# Patient Record
Sex: Female | Born: 1960
Health system: Southern US, Community
[De-identification: ages and names within clinical notes are randomized; demographics above are authoritative.]

## PROBLEM LIST (undated history)

## (undated) DIAGNOSIS — K219 Gastro-esophageal reflux disease without esophagitis: Secondary | ICD-10-CM

## (undated) DIAGNOSIS — R5383 Other fatigue: Secondary | ICD-10-CM

## (undated) DIAGNOSIS — I639 Cerebral infarction, unspecified: Secondary | ICD-10-CM

## (undated) DIAGNOSIS — T4145XA Adverse effect of unspecified anesthetic, initial encounter: Secondary | ICD-10-CM

## (undated) DIAGNOSIS — R112 Nausea with vomiting, unspecified: Secondary | ICD-10-CM

## (undated) DIAGNOSIS — T8859XA Other complications of anesthesia, initial encounter: Secondary | ICD-10-CM

## (undated) DIAGNOSIS — E785 Hyperlipidemia, unspecified: Secondary | ICD-10-CM

## (undated) DIAGNOSIS — Z9889 Other specified postprocedural states: Secondary | ICD-10-CM

## (undated) DIAGNOSIS — G459 Transient cerebral ischemic attack, unspecified: Secondary | ICD-10-CM

## (undated) HISTORY — PX: BREAST EXCISIONAL BIOPSY: SUR124

## (undated) HISTORY — PX: COLONOSCOPY: SHX174

## (undated) HISTORY — DX: Transient cerebral ischemic attack, unspecified: G45.9

## (undated) HISTORY — DX: Cerebral infarction, unspecified: I63.9

## (undated) HISTORY — DX: Other fatigue: R53.83

---

## 1997-08-15 ENCOUNTER — Other Ambulatory Visit: Admission: RE | Admit: 1997-08-15 | Discharge: 1997-08-15 | Payer: Self-pay | Admitting: Internal Medicine

## 1999-10-05 ENCOUNTER — Other Ambulatory Visit: Admission: RE | Admit: 1999-10-05 | Discharge: 1999-10-05 | Payer: Self-pay | Admitting: Internal Medicine

## 1999-12-15 ENCOUNTER — Ambulatory Visit (HOSPITAL_COMMUNITY): Admission: RE | Admit: 1999-12-15 | Discharge: 1999-12-15 | Payer: Self-pay | Admitting: Internal Medicine

## 1999-12-15 ENCOUNTER — Encounter: Payer: Self-pay | Admitting: Internal Medicine

## 2000-01-19 ENCOUNTER — Other Ambulatory Visit: Admission: RE | Admit: 2000-01-19 | Discharge: 2000-01-19 | Payer: Self-pay | Admitting: Internal Medicine

## 2001-03-06 ENCOUNTER — Ambulatory Visit (HOSPITAL_COMMUNITY): Admission: RE | Admit: 2001-03-06 | Discharge: 2001-03-06 | Payer: Self-pay | Admitting: Internal Medicine

## 2001-03-06 ENCOUNTER — Encounter: Payer: Self-pay | Admitting: Internal Medicine

## 2003-02-06 ENCOUNTER — Other Ambulatory Visit: Admission: RE | Admit: 2003-02-06 | Discharge: 2003-02-06 | Payer: Self-pay | Admitting: Internal Medicine

## 2004-02-09 ENCOUNTER — Other Ambulatory Visit: Admission: RE | Admit: 2004-02-09 | Discharge: 2004-02-09 | Payer: Self-pay | Admitting: Internal Medicine

## 2004-03-12 ENCOUNTER — Ambulatory Visit (HOSPITAL_COMMUNITY): Admission: RE | Admit: 2004-03-12 | Discharge: 2004-03-12 | Payer: Self-pay | Admitting: Internal Medicine

## 2005-02-17 ENCOUNTER — Other Ambulatory Visit: Admission: RE | Admit: 2005-02-17 | Discharge: 2005-02-17 | Payer: Self-pay | Admitting: Family

## 2005-04-26 ENCOUNTER — Ambulatory Visit (HOSPITAL_COMMUNITY): Admission: RE | Admit: 2005-04-26 | Discharge: 2005-04-26 | Payer: Self-pay | Admitting: Internal Medicine

## 2006-06-05 ENCOUNTER — Ambulatory Visit (HOSPITAL_COMMUNITY): Admission: RE | Admit: 2006-06-05 | Discharge: 2006-06-05 | Payer: Self-pay | Admitting: Internal Medicine

## 2006-08-21 ENCOUNTER — Other Ambulatory Visit: Admission: RE | Admit: 2006-08-21 | Discharge: 2006-08-21 | Payer: Self-pay | Admitting: Geriatric Medicine

## 2007-06-25 ENCOUNTER — Ambulatory Visit (HOSPITAL_COMMUNITY): Admission: RE | Admit: 2007-06-25 | Discharge: 2007-06-25 | Payer: Self-pay | Admitting: Family Medicine

## 2007-07-03 ENCOUNTER — Encounter: Admission: RE | Admit: 2007-07-03 | Discharge: 2007-07-03 | Payer: Self-pay | Admitting: Internal Medicine

## 2007-08-04 ENCOUNTER — Emergency Department (HOSPITAL_COMMUNITY): Admission: EM | Admit: 2007-08-04 | Discharge: 2007-08-04 | Payer: Self-pay | Admitting: Emergency Medicine

## 2007-08-10 ENCOUNTER — Emergency Department (HOSPITAL_COMMUNITY): Admission: EM | Admit: 2007-08-10 | Discharge: 2007-08-10 | Payer: Self-pay | Admitting: Emergency Medicine

## 2008-02-12 ENCOUNTER — Encounter: Admission: RE | Admit: 2008-02-12 | Discharge: 2008-02-12 | Payer: Self-pay | Admitting: Internal Medicine

## 2008-11-13 ENCOUNTER — Encounter: Admission: RE | Admit: 2008-11-13 | Discharge: 2008-11-13 | Payer: Self-pay | Admitting: Obstetrics and Gynecology

## 2011-02-09 ENCOUNTER — Ambulatory Visit (INDEPENDENT_AMBULATORY_CARE_PROVIDER_SITE_OTHER): Payer: 59

## 2011-02-09 ENCOUNTER — Inpatient Hospital Stay (INDEPENDENT_AMBULATORY_CARE_PROVIDER_SITE_OTHER)
Admission: RE | Admit: 2011-02-09 | Discharge: 2011-02-09 | Disposition: A | Discharge status: Home / Self Care | Payer: 59 | Source: Ambulatory Visit | Attending: Family Medicine | Admitting: Family Medicine

## 2011-02-09 DIAGNOSIS — S92919A Unspecified fracture of unspecified toe(s), initial encounter for closed fracture: Secondary | ICD-10-CM

## 2011-10-31 ENCOUNTER — Emergency Department (HOSPITAL_COMMUNITY)
Admission: EM | Admit: 2011-10-31 | Discharge: 2011-10-31 | Disposition: A | Discharge status: Home / Self Care | Payer: 59 | Source: Home / Self Care

## 2011-10-31 ENCOUNTER — Encounter (HOSPITAL_COMMUNITY): Payer: Self-pay | Admitting: Emergency Medicine

## 2011-10-31 DIAGNOSIS — T63391A Toxic effect of venom of other spider, accidental (unintentional), initial encounter: Secondary | ICD-10-CM

## 2011-10-31 DIAGNOSIS — T6391XA Toxic effect of contact with unspecified venomous animal, accidental (unintentional), initial encounter: Secondary | ICD-10-CM

## 2011-10-31 DIAGNOSIS — T63301A Toxic effect of unspecified spider venom, accidental (unintentional), initial encounter: Secondary | ICD-10-CM

## 2011-10-31 MED ORDER — TRAMADOL HCL 50 MG PO TABS: 50.0000 mg | ORAL_TABLET | Freq: Four times a day (QID) | ORAL | Status: AC | PRN

## 2011-10-31 MED ORDER — DOXYCYCLINE HYCLATE 100 MG PO TABS: 100.0000 mg | ORAL_TABLET | Freq: Two times a day (BID) | ORAL | Status: DC

## 2011-10-31 MED ORDER — DOXYCYCLINE HYCLATE 100 MG PO TABS: 100.0000 mg | ORAL_TABLET | Freq: Two times a day (BID) | ORAL | Status: AC

## 2011-10-31 MED ORDER — TRAMADOL HCL 50 MG PO TABS: 50.0000 mg | ORAL_TABLET | Freq: Four times a day (QID) | ORAL | Status: DC | PRN

## 2011-10-31 NOTE — ED Notes (Signed)
PT HERE WITH C/O POSS SPIDER BITE TO RIGHT THIGH THAT STARTED Saturday.STAES SHE WAS ABLE TO POP IT WITH MIN DRAINAGE.DENIES FEVER,CHILLS

## 2011-10-31 NOTE — Discharge Instructions (Signed)
Spider Bite  Spider bites are not common. Most spider bites do not cause serious problems. The elderly, very young children, and people with certain existing medical conditions are more likely to experience significant symptoms.  SYMPTOMS   Spider bites may not cause any pain at first. Within 1 or 2 days of the bite, there may be swelling, redness, and pain in the bite area. However, some spider bites can cause pain within the first hour.  TREATMENT   Your caregiver may prescribe antibiotic medicine if a bacterial infection develops in the bite. However, not all spider bites require antibiotics or prescription medicines.   HOME CARE INSTRUCTIONS   Do not scratch the bite area.   Keep the bite area clean and dry. Wash the area with soap and water as directed.   Put ice or cool compresses on the bite area.   Put ice in a plastic bag.   Place a towel between your skin and the bag.   Leave the ice on for 20 minutes, 4 times a day for the first 2 to 3 days, or as directed.   Keep the bite area elevated above the level of your heart. This helps reduce redness and swelling.   Only take over-the-counter or prescription medicines as directed by your caregiver.   If you are given antibiotics, take them as directed. Finish them even if you start to feel better.  You may need a tetanus shot if:   You cannot remember when you had your last tetanus shot.   You have never had a tetanus shot.   The injury broke your skin.  If you get a tetanus shot, your arm may swell, get red, and feel warm to the touch. This is common and not a problem. If you need a tetanus shot and you choose not to have one, there is a rare chance of getting tetanus. Sickness from tetanus can be serious.  SEEK MEDICAL CARE IF:  Your bite is not better after 3 days of treatment.  SEEK IMMEDIATE MEDICAL CARE IF:   Your bite turns purple or develops increased swelling, pain, or redness.   You develop shortness of breath or chest pain.   You have  muscle cramps or painful muscle spasms.   You develop abdominal pain, nausea, or vomiting.   You feel unusually tired or sleepy.  MAKE SURE YOU:   Understand these instructions.   Will watch your condition.   Will get help right away if you are not doing well or get worse.  Document Released: 06/16/2004 Document Revised: 04/28/2011 Document Reviewed: 12/08/2010  ExitCare Patient Information 2012 [employer].

## 2011-10-31 NOTE — ED Provider Notes (Signed)
History     CSN: [id number]  Arrival date & time 10/31/11  1233   First MD Initiated Contact with Patient 10/31/11 1328      No chief complaint on file.   (Consider location/radiation/quality/duration/timing/severity/associated sxs/prior treatment) HPI -year-old female who presents to urgent care with main concern of swelling on the medial aspect of the left thigh. She reports this initially started approximately 3-4 days ago and she thinks it was a spider bite. This was getting progressively worse and this morning she noted a big lump on the medial aspect of the thigh 1 inch above the knee area and she squeezed it and a lot of pus came out. She denies noticing any blood from the area. Patient reports this is getting better now, denies fevers and chills, other systemic symptoms. She denies similar episodes in the past. She reports mild pain in that area, intermittent, 5/10 in severity when present, no specific alleviating or aggravating factors, pain is mostly sharp, nonradiating.  No past medical history on file.  No past surgical history on file.  No family history on file.  History  Substance Use Topics  . Smoking status: Not on file  . Smokeless tobacco: Not on file  . Alcohol Use: Not on file    OB History    No data available      Review of Systems  Constitutional: Denies fever, chills, diaphoresis, appetite change and fatigue.  HEENT: Denies photophobia, eye pain, redness, hearing loss, ear pain, congestion, sore throat, rhinorrhea, sneezing, mouth sores, trouble swallowing, neck pain, neck stiffness and tinnitus.   Respiratory: Denies SOB, DOE, cough, chest tightness,  and wheezing.   Cardiovascular: Denies chest pain, palpitations and leg swelling.  Gastrointestinal: Denies nausea, vomiting, abdominal pain, diarrhea, constipation, blood in stool and abdominal distention.  Genitourinary: Denies dysuria, urgency, frequency, hematuria, flank pain and difficulty  urinating.  Musculoskeletal: Denies myalgias, back pain, joint swelling, arthralgias and gait problem.  Skin: per HPI Neurological: Denies dizziness, seizures, syncope, weakness, light-headedness, numbness and headaches.  Hematological: Denies adenopathy. Easy bruising, personal or family bleeding history  Psychiatric/Behavioral: Denies suicidal ideation, mood changes, confusion, nervousness, sleep disturbance and agitation  Allergies  Review of patient's allergies indicates not on file.  Home Medications  No current outpatient prescriptions on file.  There were no vitals taken for this visit.  Physical Exam  Constitutional: Vital signs reviewed.  Patient is a well-developed and well-nourished in no acute distress and cooperative with exam. Alert and oriented x3.  Cardiovascular: RRR, S1 normal, S2 normal, no MRG, pulses symmetric and intact bilaterally Pulmonary/Chest: CTAB, no wheezes, rales, or rhonchi Abdominal: Soft. Non-tender, non-distended, bowel sounds are normal, no masses, organomegaly, or guarding present.  Skin: Warm, dry and intact. No rash, cyanosis, or clubbing.  small area of erythema noted on the medial aspect of the left thigh approximately 1 inch above the knee, erythema is approximately 2 inches in diameter, slightly tender to palpation, area is also warm to touch   ED Course  Procedures (including critical care time)   Spider bite with now infected area - I have recommended course of antibiotic along with applying warm compresses to the area - will provide prescription for analgesia for adequate pain control - Patient was advised to see primary care physician if symptoms get worse or do not improve over the next 24-48 hours  MDM  Infected area secondary to spider bite Requires course of antibiotic        Dorothea Ogle, MD  10/31/11 1411 

## 2011-11-01 ENCOUNTER — Telehealth (HOSPITAL_COMMUNITY): Payer: Self-pay | Admitting: *Deleted

## 2011-11-01 MED ORDER — DOXYCYCLINE HYCLATE 100 MG PO TABS: 100.0000 mg | ORAL_TABLET | Freq: Two times a day (BID) | ORAL | Status: AC

## 2011-11-01 MED ORDER — TRAMADOL HCL 50 MG PO TABS: 100.0000 mg | ORAL_TABLET | Freq: Three times a day (TID) | ORAL | Status: AC | PRN

## 2011-11-01 NOTE — ED Notes (Signed)
1150  Pt. called with a question about her Rx.'s. Pt. in a meeting here at the hospital. I left a message on her cell phone to call.  I called the [facility] pharmacy and they had not received the Rx.'s yet.   Hannah Brady 11/01/2011

## 2011-11-01 NOTE — ED Notes (Signed)
Pt. called back and and said she initially got the hard copy of the Rx.'s but asked about faxing them to the pharmacy.  She said the nurse took the Rx.'s back and told her the doctor would e-prescribe them. She said it was not at the pharmacy.  I told her I would talk to the doctor here now and have them e-prescribed now.  I asked Dr. Lorenz Coaster to e-prescribe them to the [facility] pharmacy. I called the pharmacy and told them they were being sent now and pt. would be in at 1330 to pick them up.

## 2012-03-29 ENCOUNTER — Other Ambulatory Visit: Payer: Self-pay | Admitting: Internal Medicine

## 2012-03-29 DIAGNOSIS — R921 Mammographic calcification found on diagnostic imaging of breast: Secondary | ICD-10-CM

## 2012-03-30 ENCOUNTER — Other Ambulatory Visit (HOSPITAL_COMMUNITY)
Admission: RE | Admit: 2012-03-30 | Discharge: 2012-03-30 | Disposition: A | Discharge status: Home / Self Care | Payer: 59 | Source: Ambulatory Visit | Attending: Internal Medicine | Admitting: Internal Medicine

## 2012-03-30 ENCOUNTER — Other Ambulatory Visit: Payer: Self-pay | Admitting: Internal Medicine

## 2012-03-30 DIAGNOSIS — Z1151 Encounter for screening for human papillomavirus (HPV): Secondary | ICD-10-CM | POA: Insufficient documentation

## 2012-03-30 DIAGNOSIS — Z01419 Encounter for gynecological examination (general) (routine) without abnormal findings: Secondary | ICD-10-CM | POA: Insufficient documentation

## 2012-03-30 DIAGNOSIS — R87810 Cervical high risk human papillomavirus (HPV) DNA test positive: Secondary | ICD-10-CM | POA: Insufficient documentation

## 2012-04-09 ENCOUNTER — Ambulatory Visit
Admission: RE | Admit: 2012-04-09 | Discharge: 2012-04-09 | Disposition: A | Discharge status: Home / Self Care | Payer: 59 | Source: Ambulatory Visit | Attending: Internal Medicine | Admitting: Internal Medicine

## 2012-04-09 DIAGNOSIS — R921 Mammographic calcification found on diagnostic imaging of breast: Secondary | ICD-10-CM

## 2012-12-07 ENCOUNTER — Encounter: Payer: Self-pay | Admitting: Internal Medicine

## 2013-07-01 ENCOUNTER — Other Ambulatory Visit: Payer: Self-pay

## 2013-07-01 DIAGNOSIS — Z1231 Encounter for screening mammogram for malignant neoplasm of breast: Secondary | ICD-10-CM

## 2013-07-18 ENCOUNTER — Ambulatory Visit: Payer: 59

## 2013-08-23 ENCOUNTER — Ambulatory Visit: Payer: 59

## 2013-10-23 ENCOUNTER — Ambulatory Visit: Payer: 59

## 2013-10-30 ENCOUNTER — Encounter (INDEPENDENT_AMBULATORY_CARE_PROVIDER_SITE_OTHER): Payer: Self-pay

## 2013-10-30 ENCOUNTER — Ambulatory Visit: Payer: 59

## 2013-10-30 ENCOUNTER — Ambulatory Visit
Admission: RE | Admit: 2013-10-30 | Discharge: 2013-10-30 | Disposition: A | Discharge status: Home / Self Care | Payer: 59 | Source: Ambulatory Visit

## 2013-10-30 DIAGNOSIS — Z1231 Encounter for screening mammogram for malignant neoplasm of breast: Secondary | ICD-10-CM

## 2013-12-27 ENCOUNTER — Other Ambulatory Visit: Payer: Self-pay | Admitting: Obstetrics and Gynecology

## 2013-12-27 ENCOUNTER — Other Ambulatory Visit (HOSPITAL_COMMUNITY)
Admission: RE | Admit: 2013-12-27 | Discharge: 2013-12-27 | Disposition: A | Discharge status: Home / Self Care | Payer: 59 | Source: Ambulatory Visit | Attending: Obstetrics and Gynecology | Admitting: Obstetrics and Gynecology

## 2013-12-27 DIAGNOSIS — Z01419 Encounter for gynecological examination (general) (routine) without abnormal findings: Secondary | ICD-10-CM | POA: Diagnosis present

## 2013-12-31 LAB — CYTOLOGY - PAP

## 2015-03-19 ENCOUNTER — Other Ambulatory Visit: Payer: Self-pay | Admitting: Obstetrics and Gynecology

## 2015-03-19 ENCOUNTER — Other Ambulatory Visit (HOSPITAL_COMMUNITY)
Admission: RE | Admit: 2015-03-19 | Discharge: 2015-03-19 | Disposition: A | Discharge status: Home / Self Care | Payer: 59 | Source: Ambulatory Visit | Attending: Obstetrics and Gynecology | Admitting: Obstetrics and Gynecology

## 2015-03-19 DIAGNOSIS — Z1151 Encounter for screening for human papillomavirus (HPV): Secondary | ICD-10-CM | POA: Diagnosis not present

## 2015-03-19 DIAGNOSIS — Z01419 Encounter for gynecological examination (general) (routine) without abnormal findings: Secondary | ICD-10-CM | POA: Diagnosis present

## 2015-03-23 LAB — CYTOLOGY - PAP

## 2015-03-25 ENCOUNTER — Other Ambulatory Visit: Payer: Self-pay

## 2015-03-25 DIAGNOSIS — Z1231 Encounter for screening mammogram for malignant neoplasm of breast: Secondary | ICD-10-CM

## 2015-03-26 ENCOUNTER — Ambulatory Visit
Admission: RE | Admit: 2015-03-26 | Discharge: 2015-03-26 | Disposition: A | Discharge status: Home / Self Care | Payer: 59 | Source: Ambulatory Visit

## 2015-03-26 DIAGNOSIS — Z1231 Encounter for screening mammogram for malignant neoplasm of breast: Secondary | ICD-10-CM

## 2015-06-03 MED FILL — ESTRADIOL 0.075 MG PATCH: 0.075 | 28 days supply | Qty: 8 | Fill #1

## 2015-06-15 ENCOUNTER — Observation Stay (HOSPITAL_COMMUNITY): Payer: 59

## 2015-06-15 ENCOUNTER — Emergency Department (HOSPITAL_COMMUNITY): Payer: 59

## 2015-06-15 ENCOUNTER — Encounter (HOSPITAL_COMMUNITY): Payer: Self-pay | Admitting: Emergency Medicine

## 2015-06-15 ENCOUNTER — Inpatient Hospital Stay (HOSPITAL_COMMUNITY)
Admission: EM | Admit: 2015-06-15 | Discharge: 2015-06-17 | DRG: 041 | Disposition: A | Discharge status: Home / Self Care | Payer: 59 | Attending: Internal Medicine | Admitting: Internal Medicine

## 2015-06-15 ENCOUNTER — Ambulatory Visit (HOSPITAL_BASED_OUTPATIENT_CLINIC_OR_DEPARTMENT_OTHER): Payer: 59

## 2015-06-15 DIAGNOSIS — R531 Weakness: Secondary | ICD-10-CM | POA: Insufficient documentation

## 2015-06-15 DIAGNOSIS — G8194 Hemiplegia, unspecified affecting left nondominant side: Secondary | ICD-10-CM | POA: Diagnosis present

## 2015-06-15 DIAGNOSIS — R4781 Slurred speech: Secondary | ICD-10-CM | POA: Diagnosis present

## 2015-06-15 DIAGNOSIS — R030 Elevated blood-pressure reading, without diagnosis of hypertension: Secondary | ICD-10-CM | POA: Diagnosis not present

## 2015-06-15 DIAGNOSIS — I63411 Cerebral infarction due to embolism of right middle cerebral artery: Secondary | ICD-10-CM | POA: Diagnosis not present

## 2015-06-15 DIAGNOSIS — I639 Cerebral infarction, unspecified: Secondary | ICD-10-CM | POA: Diagnosis not present

## 2015-06-15 DIAGNOSIS — R29700 NIHSS score 0: Secondary | ICD-10-CM | POA: Diagnosis present

## 2015-06-15 DIAGNOSIS — G459 Transient cerebral ischemic attack, unspecified: Secondary | ICD-10-CM

## 2015-06-15 DIAGNOSIS — R739 Hyperglycemia, unspecified: Secondary | ICD-10-CM | POA: Diagnosis present

## 2015-06-15 DIAGNOSIS — I10 Essential (primary) hypertension: Secondary | ICD-10-CM | POA: Diagnosis present

## 2015-06-15 DIAGNOSIS — E785 Hyperlipidemia, unspecified: Secondary | ICD-10-CM | POA: Diagnosis present

## 2015-06-15 DIAGNOSIS — IMO0001 Reserved for inherently not codable concepts without codable children: Secondary | ICD-10-CM | POA: Diagnosis present

## 2015-06-15 DIAGNOSIS — G43009 Migraine without aura, not intractable, without status migrainosus: Secondary | ICD-10-CM | POA: Diagnosis not present

## 2015-06-15 DIAGNOSIS — I63231 Cerebral infarction due to unspecified occlusion or stenosis of right carotid arteries: Secondary | ICD-10-CM | POA: Diagnosis not present

## 2015-06-15 DIAGNOSIS — M6289 Other specified disorders of muscle: Secondary | ICD-10-CM | POA: Diagnosis not present

## 2015-06-15 DIAGNOSIS — R29810 Facial weakness: Secondary | ICD-10-CM | POA: Diagnosis not present

## 2015-06-15 DIAGNOSIS — I63232 Cerebral infarction due to unspecified occlusion or stenosis of left carotid arteries: Secondary | ICD-10-CM | POA: Diagnosis not present

## 2015-06-15 DIAGNOSIS — I6789 Other cerebrovascular disease: Secondary | ICD-10-CM | POA: Diagnosis not present

## 2015-06-15 HISTORY — DX: Nausea with vomiting, unspecified: Z98.890

## 2015-06-15 HISTORY — DX: Nausea with vomiting, unspecified: R11.2

## 2015-06-15 HISTORY — DX: Other complications of anesthesia, initial encounter: T88.59XA

## 2015-06-15 HISTORY — DX: Hyperlipidemia, unspecified: E78.5

## 2015-06-15 HISTORY — DX: Adverse effect of unspecified anesthetic, initial encounter: T41.45XA

## 2015-06-15 HISTORY — DX: Cerebral infarction, unspecified: I63.9

## 2015-06-15 LAB — CBC WITH DIFFERENTIAL/PLATELET
BASOS ABS: 0 10*3/uL (ref 0.0–0.1)
Basophils Relative: 1 %
EOS PCT: 2 %
Eosinophils Absolute: 0.1 10*3/uL (ref 0.0–0.7)
HEMATOCRIT: 42.4 % (ref 36.0–46.0)
Hemoglobin: 14.8 g/dL (ref 12.0–15.0)
LYMPHS PCT: 36 %
Lymphs Abs: 2.7 10*3/uL (ref 0.7–4.0)
MCH: 30.3 pg (ref 26.0–34.0)
MCHC: 34.9 g/dL (ref 30.0–36.0)
MCV: 86.9 fL (ref 78.0–100.0)
MONO ABS: 0.6 10*3/uL (ref 0.1–1.0)
MONOS PCT: 8 %
NEUTROS ABS: 4.1 10*3/uL (ref 1.7–7.7)
Neutrophils Relative %: 53 %
PLATELETS: 194 10*3/uL (ref 150–400)
RBC: 4.88 MIL/uL (ref 3.87–5.11)
RDW: 13.7 % (ref 11.5–15.5)
WBC: 7.5 10*3/uL (ref 4.0–10.5)

## 2015-06-15 LAB — COMPREHENSIVE METABOLIC PANEL
ALBUMIN: 3.8 g/dL (ref 3.5–5.0)
ALT: 23 U/L (ref 14–54)
AST: 19 U/L (ref 15–41)
Alkaline Phosphatase: 43 U/L (ref 38–126)
Anion gap: 12 (ref 5–15)
BILIRUBIN TOTAL: 0.7 mg/dL (ref 0.3–1.2)
BUN: 13 mg/dL (ref 6–20)
CO2: 23 mmol/L (ref 22–32)
CREATININE: 0.83 mg/dL (ref 0.44–1.00)
Calcium: 9.3 mg/dL (ref 8.9–10.3)
Chloride: 106 mmol/L (ref 101–111)
GFR calc Af Amer: >60 mL/min (ref >60)
GLUCOSE: 123 mg/dL — AB (ref 65–99)
Potassium: 3.7 mmol/L (ref 3.5–5.1)
Sodium: 141 mmol/L (ref 135–145)
TOTAL PROTEIN: 6.2 g/dL — AB (ref 6.5–8.1)

## 2015-06-15 LAB — URINALYSIS, ROUTINE W REFLEX MICROSCOPIC
Bilirubin Urine: NEGATIVE
Glucose, UA: NEGATIVE mg/dL
Hgb urine dipstick: NEGATIVE
KETONES UR: NEGATIVE mg/dL
LEUKOCYTES UA: NEGATIVE
NITRITE: NEGATIVE
PROTEIN: NEGATIVE mg/dL
SPECIFIC GRAVITY, URINE: 1.018 (ref 1.005–1.030)
pH: 6 (ref 5.0–8.0)

## 2015-06-15 LAB — RAPID URINE DRUG SCREEN, HOSP PERFORMED
Amphetamines: NOT DETECTED
BARBITURATES: NOT DETECTED
BENZODIAZEPINES: NOT DETECTED
Cocaine: NOT DETECTED
Opiates: NOT DETECTED
Tetrahydrocannabinol: NOT DETECTED

## 2015-06-15 LAB — TROPONIN I

## 2015-06-15 LAB — ETHANOL

## 2015-06-15 LAB — CBG MONITORING, ED: Glucose-Capillary: 114 mg/dL — ABNORMAL HIGH (ref 65–99)

## 2015-06-15 MED ORDER — ASPIRIN 325 MG PO TABS
325.0000 mg | ORAL_TABLET | Freq: Every day | ORAL | Status: DC
  Administered 2015-06-16 – 2015-06-17 (×2): 325 mg via ORAL
  Filled 2015-06-15 (×3): qty 1

## 2015-06-15 MED ORDER — IOHEXOL 350 MG/ML SOLN
50.0000 mL | Freq: Once | INTRAVENOUS | Status: AC | PRN
  Administered 2015-06-15: 50 mL via INTRAVENOUS

## 2015-06-15 MED ORDER — STROKE: EARLY STAGES OF RECOVERY BOOK
Freq: Once | Status: DC
  Filled 2015-06-15: qty 1

## 2015-06-15 MED ORDER — ONDANSETRON HCL 4 MG/2ML IJ SOLN: 4.0000 mg | Freq: Four times a day (QID) | INTRAMUSCULAR | Status: DC | PRN

## 2015-06-15 MED ORDER — ASPIRIN 300 MG RE SUPP: 300.0000 mg | Freq: Every day | RECTAL | Status: DC

## 2015-06-15 MED ORDER — ACETAMINOPHEN 650 MG RE SUPP: 650.0000 mg | Freq: Four times a day (QID) | RECTAL | Status: DC | PRN

## 2015-06-15 MED ORDER — SODIUM CHLORIDE 0.9 % IJ SOLN: 3.0000 mL | INTRAMUSCULAR | Status: DC | PRN

## 2015-06-15 MED ORDER — HYDRALAZINE HCL 20 MG/ML IJ SOLN: 5.0000 mg | INTRAMUSCULAR | Status: DC | PRN

## 2015-06-15 MED ORDER — ONDANSETRON HCL 4 MG PO TABS: 4.0000 mg | ORAL_TABLET | Freq: Four times a day (QID) | ORAL | Status: DC | PRN

## 2015-06-15 MED ORDER — ENOXAPARIN SODIUM 40 MG/0.4ML ~~LOC~~ SOLN
40.0000 mg | SUBCUTANEOUS | Status: DC
  Filled 2015-06-15 (×2): qty 0.4

## 2015-06-15 MED ORDER — ASPIRIN 325 MG PO TABS: 325.0000 mg | ORAL_TABLET | Freq: Every day | ORAL | Status: DC

## 2015-06-15 MED ORDER — ACETAMINOPHEN 325 MG PO TABS
650.0000 mg | ORAL_TABLET | Freq: Four times a day (QID) | ORAL | Status: DC | PRN
  Administered 2015-06-15 – 2015-06-16 (×3): 650 mg via ORAL
  Filled 2015-06-15 (×3): qty 2

## 2015-06-15 MED ORDER — SODIUM CHLORIDE 0.9 % IV SOLN: 250.0000 mL | INTRAVENOUS | Status: DC | PRN

## 2015-06-15 MED ORDER — SODIUM CHLORIDE 0.9 % IJ SOLN
3.0000 mL | Freq: Two times a day (BID) | INTRAMUSCULAR | Status: DC
  Administered 2015-06-15 – 2015-06-16 (×3): 3 mL via INTRAVENOUS

## 2015-06-15 MED ORDER — STROKE: EARLY STAGES OF RECOVERY BOOK
Freq: Once | Status: AC
  Administered 2015-06-15: 14:00:00

## 2015-06-15 MED ORDER — SODIUM CHLORIDE 0.9 % IJ SOLN: 3.0000 mL | Freq: Two times a day (BID) | INTRAMUSCULAR | Status: DC

## 2015-06-15 MED ORDER — ASPIRIN 81 MG PO CHEW
324.0000 mg | CHEWABLE_TABLET | Freq: Once | ORAL | Status: AC
  Administered 2015-06-15: 324 mg via ORAL
  Filled 2015-06-15: qty 4

## 2015-06-15 NOTE — ED Notes (Signed)
MD Nandigam at the bedside

## 2015-06-15 NOTE — ED Notes (Signed)
Pt ambulatory w/ steady gait to restroom. 

## 2015-06-15 NOTE — Care Management Note (Signed)
Case Management Note  Patient Details  Name: Hannah Brady MRN: [id number] Date of Birth: 12-27-1960  Subjective/Objective:                    Action/Plan: Patient was admitted with CVA. Lives at home with spouse. Will follow for discharge needs pending PT/OT evals and physician orders.  Expected Discharge Date:                  Expected Discharge Plan:     In-House Referral:     Discharge planning Services     Post Acute Care Choice:    Choice offered to:     DME Arranged:    DME Agency:     HH Arranged:    HH Agency:     Status of Service:  In process, will continue to follow  Medicare Important Message Given:    Date Medicare IM Given:    Medicare IM give by:    Date Additional Medicare IM Given:    Additional Medicare Important Message give by:     If discussed at [place] of Stay Meetings, dates discussed:    Additional CommentsRolm Baptise, RN 06/15/2015, 2:04 PM [phone]

## 2015-06-15 NOTE — Progress Notes (Signed)
  Echocardiogram 2D Echocardiogram has been performed.  Tresa Res 06/15/2015, 5:48 PM

## 2015-06-15 NOTE — Progress Notes (Signed)
Patient arrived from the ED accompanied by family. Safety precautions and orders reviewed with patient. TELE applied and confirmed. MD paged r/t pt arrival. No other distress noted. Will continue to monitor.   Ave Filter, RN

## 2015-06-15 NOTE — ED Notes (Signed)
Pt returned from CT °

## 2015-06-15 NOTE — ED Notes (Signed)
Attempted Report x1.   

## 2015-06-15 NOTE — ED Notes (Signed)
Pt being transported upstairs by [employer]

## 2015-06-15 NOTE — H&P (Signed)
[facility] History and Physical  Hannah Brady [id number] DOB: 02/25/1961 DOA: 06/15/2015  Referring physician: Dr Roxanne Mins - [facility] PCP: Irven Shelling, MD   Chief Complaint: L sided weakness  HPI: Hannah Brady is a 55 y.o. female  Pt woke from sleep @3 :30 on day of presentation unable to get out of bed due to L sided paralysis, including LE,UE, and face. Slurred speech per husband. Last nml 18:30 on 06/14/15. By the time pt presented to ED sx had completely resolved. Only current complaint is a mild R sided HA which started during EMS transport to ED. No previous attacks. No alleviating or aggravating factors.    Review of Systems:  Constitutional:  No weight loss, night sweats, Fevers, chills, fatigue.  HEENT:  No  Difficulty swallowing,Tooth/dental problems,Sore throat, Cardio-vascular:  No chest pain, Orthopnea, PND, swelling in lower extremities, anasarca,  palpitations  GI:  No heartburn, indigestiondepression or anxiety. No memory loss, abdominal pain, nausea, vomiting, diarrhea, change in bowel habits, loss of appetite  Resp:   No shortness of breath with exertion or at rest. No excess mucus, no productive cough, No non-productive cough, No coughing up of blood.No change in color of mucus.No wheezing.No chest wall deformity  Skin:  no rash or lesions.  GU:  no dysuria, change in color of urine, no urgency or frequency. No flank pain.  Musculoskeletal:   No joint pain or swelling. No decreased range of motion. No back pain.  Psych:  No change in mood or affect. No  Neuro: Per HPI  History reviewed. No pertinent past medical history. History reviewed. No pertinent past surgical history. Social History:  reports that she has never smoked. She does not have any smokeless tobacco history on file. She reports that she does not drink alcohol or use illicit drugs.  No Known Allergies  Family History  Problem Relation Age of Onset  . Diabetes Father        Prior to Admission medications   Medication Sig Start Date End Date Taking? Authorizing Provider  cholecalciferol (VITAMIN D) 1000 units tablet Take 1,000 Units by mouth daily.   Yes Historical Provider, MD  diphenhydrAMINE (BENADRYL) 25 MG tablet Take 12.5 mg by mouth at bedtime as needed for sleep.   Yes Historical Provider, MD  estradiol (CLIMARA - DOSED IN MG/24 HR) 0.075 mg/24hr patch Place 0.075 mg onto the skin once a week.   Yes Historical Provider, MD  Multiple Vitamin (MULTIVITAMIN WITH MINERALS) TABS tablet Take 1 tablet by mouth daily.   Yes Historical Provider, MD  OVER THE COUNTER MEDICATION Take 1 tablet by mouth daily. "it works"   Yes Historical Provider, MD  saccharomyces boulardii (FLORASTOR) 250 MG capsule Take 250 mg by mouth daily.   Yes Historical Provider, MD  vitamin C (ASCORBIC ACID) 500 MG tablet Take 500 mg by mouth daily.   Yes Historical Provider, MD   Physical Exam: Filed Vitals:   06/15/15 0515 06/15/15 0530 06/15/15 0545 06/15/15 0600  BP: 140/89 130/77 138/86 155/88  Pulse: 70 75 91 61  Temp:      TempSrc:      Resp: 20 18 28 24   Height:      Weight:      SpO2: 98% 97% 97% 99%    Wt Readings from Last 3 Encounters:  06/15/15 82.555 kg (182 lb)    General:  Appears calm and comfortable Eyes:  PERRL, EOMI, normal lids, iris ENT:  grossly normal hearing, lips & tongue Neck:  no LAD, masses or thyromegaly Cardiovascular:  RRR, no m/r/g. No LE edema.  Respiratory:  CTA bilaterally, no w/r/r. Normal respiratory effort. Abdomen:  soft, ntnd Skin:  no rash or induration seen on limited exam Musculoskeletal:  grossly normal tone BUE/BLE Psychiatric:  grossly normal mood and affect, speech fluent and appropriate Neurologic:  CN2-12 intact. No dysmetria bilat. Strength 5/5 bilat in UE and LE. Sits unassisted.           Labs on Admission:  Basic Metabolic Panel:  Recent Labs Lab 06/15/15 0445  NA 141  K 3.7  CL 106  CO2 23  GLUCOSE 123*   BUN 13  CREATININE 0.83  CALCIUM 9.3   Liver Function Tests:  Recent Labs Lab 06/15/15 0445  AST 19  ALT 23  ALKPHOS 43  BILITOT 0.7  PROT 6.2*  ALBUMIN 3.8   No results for input(s): LIPASE, AMYLASE in the last 168 hours. No results for input(s): AMMONIA in the last 168 hours. CBC:  Recent Labs Lab 06/15/15 0445  WBC 7.5  NEUTROABS 4.1  HGB 14.8  HCT 42.4  MCV 86.9  PLT 194   Cardiac Enzymes:  Recent Labs Lab 06/15/15 0445  TROPONINI <0.03    BNP (last 3 results) No results for input(s): BNP in the last 8760 hours.  ProBNP (last 3 results) No results for input(s): PROBNP in the last 8760 hours.   CREATININE: 0.83 (06/15/15 0445) Estimated creatinine clearance - 80.6 mL/min  CBG:  Recent Labs Lab 06/15/15 0516  GLUCAP 114*    Radiological Exams on Admission: Ct Head Wo Contrast  06/15/2015  CLINICAL DATA:  Acute onset of slurred speech and facial droop. Initial encounter. EXAM: CT HEAD WITHOUT CONTRAST TECHNIQUE: Contiguous axial images were obtained from the base of the skull through the vertex without intravenous contrast. COMPARISON:  None. FINDINGS: There is no evidence of acute infarction, mass lesion, or intra- or extra-axial hemorrhage on CT. The posterior fossa, including the cerebellum, brainstem and fourth ventricle, is within normal limits. The third and lateral ventricles, and basal ganglia are unremarkable in appearance. The cerebral hemispheres are symmetric in appearance, with normal gray-white differentiation. No mass effect or midline shift is seen. There is no evidence of fracture; visualized osseous structures are unremarkable in appearance. The visualized portions of the orbits are within normal limits. The paranasal sinuses and mastoid air cells are well-aerated. No significant soft tissue abnormalities are seen. IMPRESSION: Unremarkable noncontrast CT of the head. Electronically Signed   By: Garald Balding M.D.   On: 06/15/2015 05:03    Mr Jodene Nam Head Wo Contrast  06/15/2015  CLINICAL DATA:  The patient awoke at 3:30 a.m., unable to move her left side and with left face paralysis. Slurred speech. Symptoms have since resolved. Right-sided headache. EXAM: MRI HEAD WITHOUT CONTRAST MRA HEAD WITHOUT CONTRAST TECHNIQUE: Multiplanar, multiecho pulse sequences of the brain and surrounding structures were obtained without intravenous contrast. Angiographic images of the head were obtained using MRA technique without contrast. COMPARISON:  CT head from the same day. FINDINGS: MRI HEAD FINDINGS An 8 mm focus of restricted diffusion is present in the right centrum semi of ally on image 30 of series 4. There is an additional punctate cortical lesion in the more posterior right parietal lobe on the same image. This second lesion can also be seen on image 10 of series 8. Subtle T2 changes present in the same area. No other significant white matter disease is present. The ventricles are of normal  size. No significant extra-axial fluid collection is present. Flow is present in the major intracranial arteries. The globes and orbits are intact. The paranasal sinuses and mastoid air cells are clear. Skullbase is normal.  Midline sagittal images are unremarkable. MRA HEAD FINDINGS The internal carotid arteries are within normal limits from the high cervical segments through the ICA termini bilaterally. Posterior communicating arteries are present bilaterally, more prominent on the right. The A1 and M1 segments are normal. No definite anterior communicating artery is present. There is some attenuation of distal MCA branch vessels bilaterally. The vertebral arteries are codominant. The PICA origins are visualized and normal. The basilar artery is within normal limits. A fetal type right posterior cerebral artery is present. There is fecal contribution to the left PCA from a P1 segments and the posterior communicating artery. PCA branch vessels are intact. IMPRESSION:  1. 8 mm acute nonhemorrhagic white matter infarct within the right centrum semiovale. 2. Possible punctate cortical in the right parietal lobe as well. 3. No other significant white matter disease. 4. Mild distal MCA branch vessel attenuation. Question intracranial atherosclerosis versus vasculitis. These results were called by telephone at the time of interpretation on 06/15/2015 at 7:49 am to Dr. Vanita Panda , who verbally acknowledged these results. Electronically Signed   By: San Morelle M.D.   On: 06/15/2015 07:49   Mr Brain Wo Contrast  06/15/2015  CLINICAL DATA:  The patient awoke at 3:30 a.m., unable to move her left side and with left face paralysis. Slurred speech. Symptoms have since resolved. Right-sided headache. EXAM: MRI HEAD WITHOUT CONTRAST MRA HEAD WITHOUT CONTRAST TECHNIQUE: Multiplanar, multiecho pulse sequences of the brain and surrounding structures were obtained without intravenous contrast. Angiographic images of the head were obtained using MRA technique without contrast. COMPARISON:  CT head from the same day. FINDINGS: MRI HEAD FINDINGS An 8 mm focus of restricted diffusion is present in the right centrum semi of ally on image 30 of series 4. There is an additional punctate cortical lesion in the more posterior right parietal lobe on the same image. This second lesion can also be seen on image 10 of series 8. Subtle T2 changes present in the same area. No other significant white matter disease is present. The ventricles are of normal size. No significant extra-axial fluid collection is present. Flow is present in the major intracranial arteries. The globes and orbits are intact. The paranasal sinuses and mastoid air cells are clear. Skullbase is normal.  Midline sagittal images are unremarkable. MRA HEAD FINDINGS The internal carotid arteries are within normal limits from the high cervical segments through the ICA termini bilaterally. Posterior communicating arteries are present  bilaterally, more prominent on the right. The A1 and M1 segments are normal. No definite anterior communicating artery is present. There is some attenuation of distal MCA branch vessels bilaterally. The vertebral arteries are codominant. The PICA origins are visualized and normal. The basilar artery is within normal limits. A fetal type right posterior cerebral artery is present. There is fecal contribution to the left PCA from a P1 segments and the posterior communicating artery. PCA branch vessels are intact. IMPRESSION: 1. 8 mm acute nonhemorrhagic white matter infarct within the right centrum semiovale. 2. Possible punctate cortical in the right parietal lobe as well. 3. No other significant white matter disease. 4. Mild distal MCA branch vessel attenuation. Question intracranial atherosclerosis versus vasculitis. These results were called by telephone at the time of interpretation on 06/15/2015 at 7:49 am to Dr. Vanita Panda ,  who verbally acknowledged these results. Electronically Signed   By: San Morelle M.D.   On: 06/15/2015 07:49    EKG: Independently reviewed. NSR, no ACS  Assessment/Plan Active Problems:   TIA (transient ischemic attack)   Elevated blood pressure   Hyperglycemia   Headache, common migraine   TIA: LUE and LLE and facial weakness. Concerning for TIA vs migraine w/ aura. ABCD2 score 4. CT head unremarkable. Neuro exam nml.  - Tele Obs - MRI/MRA head - Carotid dopplers - Echo - PT/OT - lipids, A1c - tylenol for HA.    HTN: intermittent. No previous hx - hydralazine prn  Hyperglycemia:  No h/o DM - A1c   Code Status: FULL - assumed  DVT Prophylaxis: Lovenox Family Communication: husband Disposition Plan: Pending Improvement    MERRELL, DAVID J, MD Family Medicine [facility] [email or web address] Password [email or web address]

## 2015-06-15 NOTE — Progress Notes (Signed)
Progress note.   MRI showing stroke Neuro consulted and will evaluate prior to DC.  Linna Darner, MD Family Medicine 06/15/2015, 10:34 AM

## 2015-06-15 NOTE — ED Notes (Signed)
Taken to MRI at this time

## 2015-06-15 NOTE — ED Notes (Signed)
Pt returned from MRI. MD at the bedside

## 2015-06-15 NOTE — Consult Note (Signed)
Requesting Physician: Dr. Marily Memos    Chief Complaint: Stroke  HPI:                                                                                                                                         Hannah Brady is an 55 y.o. female with no past medical history. LSN 0330 in AM.  She went to get out of bed to turn her alarm off and noted she could not move her left side and had a right facial droop. Lasted for about 10 minutes and fully resolved. She cam to ED to get checked out. MRI confirms right brain CVA.   Date last known well: Today Time last known well: Time: 03:30 tPA Given: No: fully resolved    History reviewed. No pertinent past medical history.  History reviewed. No pertinent past surgical history.  Family History  Problem Relation Age of Onset  . Diabetes Father    Social History:  reports that she has never smoked. She does not have any smokeless tobacco history on file. She reports that she does not drink alcohol or use illicit drugs.  Allergies: No Known Allergies  Medications:                                                                                                                           Current Facility-Administered Medications  Medication Dose Route Frequency Provider Last Rate Last Dose  .  stroke: mapping our early stages of recovery book   Does not apply Once Waldemar Dickens, MD   Stopped at 06/15/15 0915  . 0.9 %  sodium chloride infusion  250 mL Intravenous PRN Waldemar Dickens, MD      . acetaminophen (TYLENOL) tablet 650 mg  650 mg Oral Q6H PRN Waldemar Dickens, MD   650 mg at 06/15/15 [id number]   Or  . acetaminophen (TYLENOL) suppository 650 mg  650 mg Rectal Q6H PRN Waldemar Dickens, MD      . Derrill Memo ON 06/16/2015] aspirin suppository 300 mg  300 mg Rectal Daily Waldemar Dickens, MD       Or  . Derrill Memo ON 06/16/2015] aspirin tablet 325 mg  325 mg Oral Daily Waldemar Dickens, MD      . enoxaparin (LOVENOX) injection 40 mg  40 mg Subcutaneous Q24H  Waldemar Dickens, MD      .  hydrALAZINE (APRESOLINE) injection 5-10 mg  5-10 mg Intravenous Q4H PRN Waldemar Dickens, MD      . ondansetron [facility]) tablet 4 mg  4 mg Oral Q6H PRN Waldemar Dickens, MD       Or  . ondansetron [facility]) injection 4 mg  4 mg Intravenous Q6H PRN Waldemar Dickens, MD      . sodium chloride 0.9 % injection 3 mL  3 mL Intravenous Q12H Waldemar Dickens, MD   3 mL at 06/15/15 1019  . sodium chloride 0.9 % injection 3 mL  3 mL Intravenous PRN Waldemar Dickens, MD       Current Outpatient Prescriptions  Medication Sig Dispense Refill  . cholecalciferol (VITAMIN D) 1000 units tablet Take 1,000 Units by mouth daily.    . diphenhydrAMINE (BENADRYL) 25 MG tablet Take 12.5 mg by mouth at bedtime as needed for sleep.    Marland Kitchen estradiol (CLIMARA - DOSED IN MG/24 HR) 0.075 mg/24hr patch Place 0.075 mg onto the skin once a week.    . Multiple Vitamin (MULTIVITAMIN WITH MINERALS) TABS tablet Take 1 tablet by mouth daily.    Marland Kitchen OVER THE COUNTER MEDICATION Take 1 tablet by mouth daily. "it works"    . saccharomyces boulardii (FLORASTOR) 250 MG capsule Take 250 mg by mouth daily.    . vitamin C (ASCORBIC ACID) 500 MG tablet Take 500 mg by mouth daily.       ROS:                                                                                                                                       History obtained from the patient  General ROS: negative for - chills, fatigue, fever, night sweats, weight gain or weight loss Psychological ROS: negative for - behavioral disorder, hallucinations, memory difficulties, mood swings or suicidal ideation Ophthalmic ROS: negative for - blurry vision, double vision, eye pain or loss of vision ENT ROS: negative for - epistaxis, nasal discharge, oral lesions, sore throat, tinnitus or vertigo Allergy and Immunology ROS: negative for - hives or itchy/watery eyes Hematological and Lymphatic ROS: negative for - bleeding problems, bruising or swollen lymph  nodes Endocrine ROS: negative for - galactorrhea, hair pattern changes, polydipsia/polyuria or temperature intolerance Respiratory ROS: negative for - cough, hemoptysis, shortness of breath or wheezing Cardiovascular ROS: negative for - chest pain, dyspnea on exertion, edema or irregular heartbeat Gastrointestinal ROS: negative for - abdominal pain, diarrhea, hematemesis, nausea/vomiting or stool incontinence Genito-Urinary ROS: negative for - dysuria, hematuria, incontinence or urinary frequency/urgency Musculoskeletal ROS: negative for - joint swelling or muscular weakness Neurological ROS: as noted in HPI Dermatological ROS: negative for rash and skin lesion changes  Neurologic Examination:  Blood pressure 140/67, pulse 73, temperature 97.6 F (36.4 C), temperature source Oral, resp. rate 20, height 5\' 4"  (1.626 m), weight 82.555 kg (182 lb), SpO2 100 %.  HEENT-  Normocephalic, no lesions, without obvious abnormality.  Normal external eye and conjunctiva.  Normal TM's bilaterally.  Normal auditory canals and external ears. Normal external nose, mucus membranes and septum.  Normal pharynx. Cardiovascular- S1, S2 normal, pulses palpable throughout   Lungs- chest clear, no wheezing, rales, normal symmetric air entry Abdomen- normal findings: bowel sounds normal Extremities- no edema Lymph-no adenopathy palpable Musculoskeletal-no joint tenderness, deformity or swelling Skin-warm and dry, no hyperpigmentation, vitiligo, or suspicious lesions  Neurological Examination Mental Status: Alert, oriented, thought content appropriate.  Speech fluent without evidence of aphasia.  Able to follow 3 step commands without difficulty. Cranial Nerves: II: Discs flat bilaterally; Visual fields grossly normal, pupils equal, round, reactive to light and accommodation III,IV, VI: ptosis not present,  extra-ocular motions intact bilaterally V,VII: smile symmetric, facial light touch sensation normal bilaterally VIII: hearing normal bilaterally IX,X: uvula rises symmetrically XI: bilateral shoulder shrug XII: midline tongue extension Motor: Right : Upper extremity   5/5    Left:     Upper extremity   5/5  Lower extremity   5/5     Lower extremity   5/5 Tone and bulk:normal tone throughout; no atrophy noted Sensory: Pinprick and light touch intact throughout, bilaterally Deep Tendon Reflexes: 2+ and symmetric throughout Plantars: Right: downgoing   Left: downgoing Cerebellar: normal finger-to-nose, normal rapid alternating movements and normal heel-to-shin test Gait: normal gait and station       Lab Results: Basic Metabolic Panel:  Recent Labs Lab 06/15/15 0445  NA 141  K 3.7  CL 106  CO2 23  GLUCOSE 123*  BUN 13  CREATININE 0.83  CALCIUM 9.3    Liver Function Tests:  Recent Labs Lab 06/15/15 0445  AST 19  ALT 23  ALKPHOS 43  BILITOT 0.7  PROT 6.2*  ALBUMIN 3.8   No results for input(s): LIPASE, AMYLASE in the last 168 hours. No results for input(s): AMMONIA in the last 168 hours.  CBC:  Recent Labs Lab 06/15/15 0445  WBC 7.5  NEUTROABS 4.1  HGB 14.8  HCT 42.4  MCV 86.9  PLT 194    Cardiac Enzymes:  Recent Labs Lab 06/15/15 0445  TROPONINI <0.03    Lipid Panel: No results for input(s): CHOL, TRIG, HDL, CHOLHDL, VLDL, LDLCALC in the last 168 hours.  CBG:  Recent Labs Lab 06/15/15 0516  GLUCAP 114*    Microbiology: No results found for this or any previous visit.  Coagulation Studies: No results for input(s): LABPROT, INR in the last 72 hours.  Imaging: Ct Head Wo Contrast  06/15/2015  CLINICAL DATA:  Acute onset of slurred speech and facial droop. Initial encounter. EXAM: CT HEAD WITHOUT CONTRAST TECHNIQUE: Contiguous axial images were obtained from the base of the skull through the vertex without intravenous contrast.  COMPARISON:  None. FINDINGS: There is no evidence of acute infarction, mass lesion, or intra- or extra-axial hemorrhage on CT. The posterior fossa, including the cerebellum, brainstem and fourth ventricle, is within normal limits. The third and lateral ventricles, and basal ganglia are unremarkable in appearance. The cerebral hemispheres are symmetric in appearance, with normal gray-white differentiation. No mass effect or midline shift is seen. There is no evidence of fracture; visualized osseous structures are unremarkable in appearance. The visualized portions of the orbits are within normal limits. The paranasal  sinuses and mastoid air cells are well-aerated. No significant soft tissue abnormalities are seen. IMPRESSION: Unremarkable noncontrast CT of the head. Electronically Signed   By: Garald Balding M.D.   On: 06/15/2015 05:03   Mr Jodene Nam Head Wo Contrast  06/15/2015  CLINICAL DATA:  The patient awoke at 3:30 a.m., unable to move her left side and with left face paralysis. Slurred speech. Symptoms have since resolved. Right-sided headache. EXAM: MRI HEAD WITHOUT CONTRAST MRA HEAD WITHOUT CONTRAST TECHNIQUE: Multiplanar, multiecho pulse sequences of the brain and surrounding structures were obtained without intravenous contrast. Angiographic images of the head were obtained using MRA technique without contrast. COMPARISON:  CT head from the same day. FINDINGS: MRI HEAD FINDINGS An 8 mm focus of restricted diffusion is present in the right centrum semi of ally on image 30 of series 4. There is an additional punctate cortical lesion in the more posterior right parietal lobe on the same image. This second lesion can also be seen on image 10 of series 8. Subtle T2 changes present in the same area. No other significant white matter disease is present. The ventricles are of normal size. No significant extra-axial fluid collection is present. Flow is present in the major intracranial arteries. The globes and orbits  are intact. The paranasal sinuses and mastoid air cells are clear. Skullbase is normal.  Midline sagittal images are unremarkable. MRA HEAD FINDINGS The internal carotid arteries are within normal limits from the high cervical segments through the ICA termini bilaterally. Posterior communicating arteries are present bilaterally, more prominent on the right. The A1 and M1 segments are normal. No definite anterior communicating artery is present. There is some attenuation of distal MCA branch vessels bilaterally. The vertebral arteries are codominant. The PICA origins are visualized and normal. The basilar artery is within normal limits. A fetal type right posterior cerebral artery is present. There is fecal contribution to the left PCA from a P1 segments and the posterior communicating artery. PCA branch vessels are intact. IMPRESSION: 1. 8 mm acute nonhemorrhagic white matter infarct within the right centrum semiovale. 2. Possible punctate cortical in the right parietal lobe as well. 3. No other significant white matter disease. 4. Mild distal MCA branch vessel attenuation. Question intracranial atherosclerosis versus vasculitis. These results were called by telephone at the time of interpretation on 06/15/2015 at 7:49 am to Dr. Vanita Panda , who verbally acknowledged these results. Electronically Signed   By: San Morelle M.D.   On: 06/15/2015 07:49   Mr Brain Wo Contrast  06/15/2015  CLINICAL DATA:  The patient awoke at 3:30 a.m., unable to move her left side and with left face paralysis. Slurred speech. Symptoms have since resolved. Right-sided headache. EXAM: MRI HEAD WITHOUT CONTRAST MRA HEAD WITHOUT CONTRAST TECHNIQUE: Multiplanar, multiecho pulse sequences of the brain and surrounding structures were obtained without intravenous contrast. Angiographic images of the head were obtained using MRA technique without contrast. COMPARISON:  CT head from the same day. FINDINGS: MRI HEAD FINDINGS An 8 mm focus  of restricted diffusion is present in the right centrum semi of ally on image 30 of series 4. There is an additional punctate cortical lesion in the more posterior right parietal lobe on the same image. This second lesion can also be seen on image 10 of series 8. Subtle T2 changes present in the same area. No other significant white matter disease is present. The ventricles are of normal size. No significant extra-axial fluid collection is present. Flow is present in the  major intracranial arteries. The globes and orbits are intact. The paranasal sinuses and mastoid air cells are clear. Skullbase is normal.  Midline sagittal images are unremarkable. MRA HEAD FINDINGS The internal carotid arteries are within normal limits from the high cervical segments through the ICA termini bilaterally. Posterior communicating arteries are present bilaterally, more prominent on the right. The A1 and M1 segments are normal. No definite anterior communicating artery is present. There is some attenuation of distal MCA branch vessels bilaterally. The vertebral arteries are codominant. The PICA origins are visualized and normal. The basilar artery is within normal limits. A fetal type right posterior cerebral artery is present. There is fecal contribution to the left PCA from a P1 segments and the posterior communicating artery. PCA branch vessels are intact. IMPRESSION: 1. 8 mm acute nonhemorrhagic white matter infarct within the right centrum semiovale. 2. Possible punctate cortical in the right parietal lobe as well. 3. No other significant white matter disease. 4. Mild distal MCA branch vessel attenuation. Question intracranial atherosclerosis versus vasculitis. These results were called by telephone at the time of interpretation on 06/15/2015 at 7:49 am to Dr. Vanita Panda , who verbally acknowledged these results. Electronically Signed   By: San Morelle M.D.   On: 06/15/2015 07:49       Assessment and plan discussed  with with attending physician and they are in agreement.    Etta Quill PA-C [facility] [phone]  06/15/2015, 10:47 AM   Assessment: 55 y.o. female with acute stroke.  Symptoms have fully resolved.  patietn will benefit from stroke work up.   Stroke Risk Factors - none  1. HgbA1c, fasting lipid panel 2. MRI, MRA  of the brain without contrast 3. PT consult, OT consult, Speech consult 4. Echocardiogram 5. Carotid dopplers 6. Prophylactic therapy-Antiplatelet med: Aspirin - dose 325 mg daily 7. Risk factor modification 8. Telemetry monitoring 9. Frequent neuro checks 10 NPO until passes stroke swallow screen 11 please page stroke NP  Or  PA  Or MD  M-F from 8am -4 pm starting 06/16/2015 as this followed by the stroke team at this point.   You can look them up on [email or web address]  Password [email or web address]

## 2015-06-15 NOTE — ED Notes (Signed)
Patient with slurred speech and facial droop at home that was seen by husband.  Patient continued to have slurred speech upon EMS arrival, but it has resolved along with the facial droop en route to ED.  Patient was getting up to get ready for work when this started.  Patient with stable VS en route to ED.  She is CAOX4, no slurred speech, no gait disturbances, equal grip strength bilaterally.

## 2015-06-15 NOTE — ED Notes (Signed)
Ordered pt [facility] [employer]

## 2015-06-15 NOTE — ED Provider Notes (Signed)
CSN: [id number]     Arrival date & time 06/15/15  [id number] History   First MD Initiated Contact with Patient 06/15/15 [phone]     Chief Complaint  Patient presents with  . Stroke Symptoms     (Consider location/radiation/quality/duration/timing/severity/associated sxs/prior Treatment) The history is provided by the patient.  55 year old female woke up at about 3:30 AM and noticed that she was unable to move to get out of bed. She realized she could not move her left side and at the left side of her face was not moving. Her husband noted that her speech was slurred. She was last known normal when she went to bed at about 6:30 PM. Symptoms have completely resolved and she now can speak normally is moving normally. She does have a mild right parietal headache which developed upon getting in the ambulance. She's never had symptoms like this before. She denies history of hypertension, diabetes, hyperlipidemia and she is a nonsmoker.  History reviewed. No pertinent past medical history. History reviewed. No pertinent past surgical history. No family history on file. Social History  Substance Use Topics  . Smoking status: Never Smoker   . Smokeless tobacco: None  . Alcohol Use: No   OB History    No data available     Review of Systems  All other systems reviewed and are negative.     Allergies  Review of patient's allergies indicates no known allergies.  Home Medications   Prior to Admission medications   Not on File   BP 143/84 mmHg  Pulse 84  Temp(Src) 97.6 F (36.4 C) (Oral)  Resp 18  Ht 5\' 4"  (1.626 m)  Wt 182 lb (82.555 kg)  BMI 31.22 kg/m2  SpO2 95% Physical Exam  Nursing note and vitals reviewed.  55 year old female, resting comfortably and in no acute distress. Vital signs are significant for mild hypertension. Oxygen saturation is 95%, which is normal. Head is normocephalic and atraumatic. PERRLA, EOMI. Oropharynx is clear. Neck is nontender and supple without  adenopathy or JVD. There are no carotid bruits. Back is nontender and there is no CVA tenderness. Lungs are clear without rales, wheezes, or rhonchi. Chest is nontender. Heart has regular rate and rhythm without murmur. Abdomen is soft, flat, nontender without masses or hepatosplenomegaly and peristalsis is normoactive. Extremities have no cyanosis or edema, full range of motion is present. Skin is warm and dry without rash. Neurologic: Mental status is normal, cranial nerves are intact. Motor strength is 5/5 in both arms and both legs. There is no pronator drift. There are no sensory deficits.  ED Course  Procedures (including critical care time) Labs Review Results for orders placed or performed during the hospital encounter of 06/15/15  Comprehensive metabolic panel  Result Value Ref Range   Sodium 141 135 - 145 mmol/L   Potassium 3.7 3.5 - 5.1 mmol/L   Chloride 106 101 - 111 mmol/L   CO2 23 22 - 32 mmol/L   Glucose, Bld 123 (H) 65 - 99 mg/dL   BUN 13 6 - 20 mg/dL   Creatinine, Ser 0.83 0.44 - 1.00 mg/dL   Calcium 9.3 8.9 - 10.3 mg/dL   Total Protein 6.2 (L) 6.5 - 8.1 g/dL   Albumin 3.8 3.5 - 5.0 g/dL   AST 19 15 - 41 U/L   ALT 23 14 - 54 U/L   Alkaline Phosphatase 43 38 - 126 U/L   Total Bilirubin 0.7 0.3 - 1.2 mg/dL  GFR calc non Af Amer >60 >60 mL/min   GFR calc Af Amer >60 >60 mL/min   Anion gap 12 5 - 15  Troponin I  Result Value Ref Range   Troponin I <0.03 <0.031 ng/mL  CBC with Differential  Result Value Ref Range   WBC 7.5 4.0 - 10.5 K/uL   RBC 4.88 3.87 - 5.11 MIL/uL   Hemoglobin 14.8 12.0 - 15.0 g/dL   HCT 42.4 36.0 - 46.0 %   MCV 86.9 78.0 - 100.0 fL   MCH 30.3 26.0 - 34.0 pg   MCHC 34.9 30.0 - 36.0 g/dL   RDW 13.7 11.5 - 15.5 %   Platelets 194 150 - 400 K/uL   Neutrophils Relative % 53 %   Neutro Abs 4.1 1.7 - 7.7 K/uL   Lymphocytes Relative 36 %   Lymphs Abs 2.7 0.7 - 4.0 K/uL   Monocytes Relative 8 %   Monocytes Absolute 0.6 0.1 - 1.0 K/uL    Eosinophils Relative 2 %   Eosinophils Absolute 0.1 0.0 - 0.7 K/uL   Basophils Relative 1 %   Basophils Absolute 0.0 0.0 - 0.1 K/uL  Urine rapid drug screen (hosp performed)  Result Value Ref Range   Opiates NONE DETECTED NONE DETECTED   Cocaine NONE DETECTED NONE DETECTED   Benzodiazepines NONE DETECTED NONE DETECTED   Amphetamines NONE DETECTED NONE DETECTED   Tetrahydrocannabinol NONE DETECTED NONE DETECTED   Barbiturates NONE DETECTED NONE DETECTED  Urinalysis, Routine w reflex microscopic  Result Value Ref Range   Color, Urine YELLOW YELLOW   APPearance CLEAR CLEAR   Specific Gravity, Urine 1.018 1.005 - 1.030   pH 6.0 5.0 - 8.0   Glucose, UA NEGATIVE NEGATIVE mg/dL   Hgb urine dipstick NEGATIVE NEGATIVE   Bilirubin Urine NEGATIVE NEGATIVE   Ketones, ur NEGATIVE NEGATIVE mg/dL   Protein, ur NEGATIVE NEGATIVE mg/dL   Nitrite NEGATIVE NEGATIVE   Leukocytes, UA NEGATIVE NEGATIVE  Ethanol  Result Value Ref Range   Alcohol, Ethyl (B) <5 <5 mg/dL  CBG monitoring, ED  Result Value Ref Range   Glucose-Capillary 114 (H) 65 - 99 mg/dL   Imaging Review Ct Head Wo Contrast  06/15/2015  CLINICAL DATA:  Acute onset of slurred speech and facial droop. Initial encounter. EXAM: CT HEAD WITHOUT CONTRAST TECHNIQUE: Contiguous axial images were obtained from the base of the skull through the vertex without intravenous contrast. COMPARISON:  None. FINDINGS: There is no evidence of acute infarction, mass lesion, or intra- or extra-axial hemorrhage on CT. The posterior fossa, including the cerebellum, brainstem and fourth ventricle, is within normal limits. The third and lateral ventricles, and basal ganglia are unremarkable in appearance. The cerebral hemispheres are symmetric in appearance, with normal gray-white differentiation. No mass effect or midline shift is seen. There is no evidence of fracture; visualized osseous structures are unremarkable in appearance. The visualized portions of the  orbits are within normal limits. The paranasal sinuses and mastoid air cells are well-aerated. No significant soft tissue abnormalities are seen. IMPRESSION: Unremarkable noncontrast CT of the head. Electronically Signed   By: Garald Balding M.D.   On: 06/15/2015 05:03   I have personally reviewed and evaluated these images and lab results as part of my medical decision-making.   EKG Interpretation   Date/Time:  Monday June 15 2015 04:26:49 EST Ventricular Rate:  71 PR Interval:  187 QRS Duration: 100 QT Interval:  399 QTC Calculation: 434 R Axis:   5 Text Interpretation:  Sinus rhythm Normal ECG No old tracing to compare  Confirmed by [facility]  MD, Mandisa Persinger ([id number]) on 06/15/2015 4:40:56 AM      MDM   Final diagnoses:  TIA (transient ischemic attack)    TIA. Initial workup has been initiated and she will need to be admitted to completely TIA workup. Old records are reviewed and she has no relevant past visits.  Initial evaluation is unremarkable. Case is discussed with Dr. Tamala Julian of [facility] who agrees to admit the patient under observation status.  Delora Fuel, MD [phone]

## 2015-06-16 ENCOUNTER — Ambulatory Visit (HOSPITAL_COMMUNITY): Payer: 59

## 2015-06-16 LAB — C-REACTIVE PROTEIN: CRP: <0.5 mg/dL (ref <1.0)

## 2015-06-16 LAB — SEDIMENTATION RATE: Sed Rate: 2 mm/hr (ref 0–22)

## 2015-06-16 LAB — LIPID PANEL
Cholesterol: 293 mg/dL — ABNORMAL HIGH (ref 0–200)
HDL: 52 mg/dL (ref >40)
LDL CALC: 198 mg/dL — AB (ref 0–99)
Total CHOL/HDL Ratio: 5.6 RATIO
Triglycerides: 217 mg/dL — ABNORMAL HIGH (ref <150)
VLDL: 43 mg/dL — AB (ref 0–40)

## 2015-06-16 LAB — ANTITHROMBIN III: ANTITHROMB III FUNC: 103 % (ref 75–120)

## 2015-06-16 MED ORDER — TRAMADOL HCL 50 MG PO TABS: 100.0000 mg | ORAL_TABLET | Freq: Four times a day (QID) | ORAL | Status: DC | PRN

## 2015-06-16 MED ORDER — ATORVASTATIN CALCIUM 40 MG PO TABS
40.0000 mg | ORAL_TABLET | Freq: Every day | ORAL | Status: DC
Start: 2015-06-16 — End: 2015-06-17
  Administered 2015-06-16 – 2015-06-17 (×2): 40 mg via ORAL
  Filled 2015-06-16 (×2): qty 1

## 2015-06-16 NOTE — Progress Notes (Signed)
STROKE TEAM PROGRESS NOTE   HISTORY OF PRESENT ILLNESS Hannah Brady is an 56 y.o. female with no past medical history. LSN 06/15/2015 at 0330 in AM. She went to get out of bed to turn her alarm off and noted she could not move her left side and had a right facial droop. Lasted for about 10 minutes and fully resolved. She cam to ED to get checked out. MRI confirms right brain CVA. Patient was not administered TPA secondary to NIHSS=0, delay in arrival. She was admitted for further evaluation and treatment.   SUBJECTIVE (INTERVAL HISTORY) No family is at the bedside.  Overall she feels her condition is completely resolved. She recounted her HPI with Dr. Leonie Brady. No hx miscarriages.    OBJECTIVE Temp:  [97.8 F (36.6 C)-98.4 F (36.9 C)] 98.4 F (36.9 C) (01/24 0936) Pulse Rate:  [61-80] 78 (01/24 0936) Cardiac Rhythm:  [-] Heart block (01/24 0742) Resp:  [15-18] 18 (01/24 0628) BP: (110-140)/(66-90) 140/69 mmHg (01/24 0936) SpO2:  [97 %-100 %] 97 % (01/24 0936)  CBC:   Recent Labs Lab 06/15/15 0445  WBC 7.5  NEUTROABS 4.1  HGB 14.8  HCT 42.4  MCV 86.9  PLT 299    Basic Metabolic Panel:   Recent Labs Lab 06/15/15 0445  NA 141  K 3.7  CL 106  CO2 23  GLUCOSE 123*  BUN 13  CREATININE 0.83  CALCIUM 9.3    Lipid Panel:     Component Value Date/Time   CHOL 293* 06/16/2015 0559   TRIG 217* 06/16/2015 0559   HDL 52 06/16/2015 0559   CHOLHDL 5.6 06/16/2015 0559   VLDL 43* 06/16/2015 0559   LDLCALC 198* 06/16/2015 0559   HgbA1c: No results found for: HGBA1C Urine Drug Screen:     Component Value Date/Time   LABOPIA NONE DETECTED 06/15/2015 0433   COCAINSCRNUR NONE DETECTED 06/15/2015 0433   LABBENZ NONE DETECTED 06/15/2015 0433   AMPHETMU NONE DETECTED 06/15/2015 0433   THCU NONE DETECTED 06/15/2015 0433   LABBARB NONE DETECTED 06/15/2015 0433      IMAGING  Ct Head Wo Contrast 06/15/2015   Unremarkable noncontrast CT of the head.   Ct Angio Head &  Neck W/cm &/or Wo/cm 06/15/2015  1. Atherosclerotic changes with noncalcified plaque at the carotid bifurcations bilaterally. There is no significant stenosis relative to the distal vessels. 2. No other focal disease within the carotid system bilaterally. 3. Normal appearance of the vertebral arteries and posterior circulation. 4. The tiny infarcts are not visible by CT.   Mr Brain Wo Contrast 06/15/2015  1. 8 mm acute nonhemorrhagic white matter infarct within the right centrum semiovale. 2. Possible punctate cortical in the right parietal lobe as well. 3. No other significant white matter disease.   Mr Jodene Nam Head Wo Contrast 06/15/2015  4. Mild distal MCA branch vessel attenuation. Question intracranial atherosclerosis versus vasculitis.   2D Echocardiogram  - Left ventricle: The cavity size was normal. There was mild concentric hypertrophy. Systolic function was normal. The estimated ejection fraction was in the range of 55% to 60%. Wallmotion was normal; there were no regional wall motionabnormalities. Left ventricular diastolic function parameterswere normal. - Aortic valve: There was mild regurgitation. - Mitral valve: There was mild regurgitation. - Right ventricle: The cavity size was normal. Wall thickness wasnormal. Systolic function was normal. - Tricuspid valve: There was no regurgitation.   PHYSICAL EXAM  Pleasant middle-aged lady currently not in distress. . Afebrile. Head is nontraumatic. Neck is  supple without bruit.    Cardiac exam no murmur or gallop. Lungs are clear to auscultation. Distal pulses are well felt. Neurological Exam ; Awake alert oriented x 3 normal speech and language. Mild left lower face asymmetry. Tongue midline. No drift. Mild diminished fine finger movements on left. Orbits right over left upper extremity.  . Normal sensation . Normal coordination.:  ASSESSMENT/PLAN Hannah Brady is a 55 y.o. female with no significant past medical history  presenting with transient left-sided weakness. She did not receive IV t-PA due to delay in arrival, NIH stroke scale = 0, resolved deficits.   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 Arranged with [place] for tomorrow.  (I have made patient NPO after midnight tonight).  Check bilateral lower extremity venous dopplers to rule out DVT as possible source of stroke.  If TEE negative, a [place] electrophysiologist will consult and consider placement of an implantable loop recorder to evaluate for atrial fibrillation as etiology of stroke. This has been explained to patient/family by Dr. Leonie Brady and they are agreeable.  Given her young age, will check hypercoagulable tests (lupus anticoagulant, cardiolipin antibody) and vasculitic labs (C3, C4, CH50, ANA, ESR) as well as HIV and RPR as possible stroke sources.   LDL 198  HgbA1c pending  Lovenox 40 mg sq daily for VTE prophylaxis Diet Heart Room service appropriate?: Yes; Fluid consistency:: Thin  No antithrombotic prior to admission, now on aspirin 325 mg daily  Patient counseled to be compliant with her antithrombotic medications  Ongoing aggressive stroke risk factor management  Therapy recommendations:  No therapy needs  Disposition:  Return home (lives w/ husband)  Hyperlipidemia  Home meds:  No statin  LDL 198, goal < 70  New lipitor 40 mg added  Continue statin at discharge  Headache  Secondary to stroke  Tylenol prn  Can give tramadol if worsens  Other Active Problems  On estradiol .07 - > recommend no hormones. Pt understands.  Family hx of blood clots in  their legs  Hospital day # [place] for Pager information 06/16/2015 1:21 PM  I have personally examined this patient, reviewed notes, independently viewed imaging studies, participated in medical decision making and plan of care. I have made any additions or clarifications directly to the above note. Agree with note above. She presented with embolic right middle cerebral artery infarct etiology to be determined. She remains at risk for neurological worsening, recurrent stroke, TIA needs ongoing stroke evaluation and aggressive risk factor modification. Recommend check TEE , hypercoagulable panel,and loop recorder insertion. Discussed with patient and family and answered questions.  Antony Contras, MD Medical Director [facility] Pager: [phone] 06/16/2015 4:10 PM     To contact Stroke Continuity provider, please refer to [email or web address] After hours, contact General Neurology

## 2015-06-16 NOTE — Evaluation (Signed)
Physical Therapy Evaluation and Discharge Patient Details Name: Hannah Brady MRN: [id number] DOB: March 26, 1961 Today's Date: 06/16/2015   History of Present Illness  Adm Lt sided weakness with fall. MRI +small infarcts Rt centrum ovale and Rt parietal. PMHx-none  Clinical Impression  Explained role of Physical Therapy and pt eager to have her strength and balance assessed. Patient evaluated by Physical Therapy with no further acute PT needs identified. All education has been completed and the patient has no further questions. Patient agrees she is back to baseline with strength and mobility. Scored 24/24 Dynamic Gait Index, 55/56 Berg Balance Test.  PT is signing off. Thank you for this referral.     Follow Up Recommendations No PT follow up    Equipment Recommendations  None recommended by PT    Recommendations for Other Services       Precautions / Restrictions Precautions Precautions: None      Mobility  Bed Mobility Overal bed mobility: Independent                Transfers Overall transfer level: Independent Equipment used: None                Ambulation/Gait Ambulation/Gait assistance: Independent Ambulation Distance (Feet): 250 Feet Assistive device: None Gait Pattern/deviations: WFL(Within Functional Limits)   Gait velocity interpretation: at or above normal speed for age/gender    Stairs Stairs: Yes Stairs assistance: Independent Stair Management: No rails;Forwards Number of Stairs: 5    Wheelchair Mobility    Modified Rankin (Stroke Patients Only) Modified Rankin (Stroke Patients Only) Pre-Morbid Rankin Score: No symptoms Modified Rankin: No symptoms     Balance Overall balance assessment: Independent                               Standardized Balance Assessment Standardized Balance Assessment : Berg Balance Test;Dynamic Gait Index Berg Balance Test Sit to Stand: Able to stand without using hands and stabilize  independently Standing Unsupported: Able to stand safely 2 minutes Sitting with Back Unsupported but Feet Supported on Floor or Stool: Able to sit safely and securely 2 minutes Stand to Sit: Sits safely with minimal use of hands Transfers: Able to transfer safely, minor use of hands Standing Unsupported with Eyes Closed: Able to stand 10 seconds safely Standing Ubsupported with Feet Together: Able to place feet together independently and stand 1 minute safely From Standing, Reach Forward with Outstretched Arm: Can reach confidently >25 cm (10") From Standing Position, Pick up Object from Floor: Able to pick up shoe safely and easily From Standing Position, Turn to Look Behind Over each Shoulder: Looks behind from both sides and weight shifts well Turn 360 Degrees: Able to turn 360 degrees safely in 4 seconds or less Standing Unsupported, Alternately Place Feet on Step/Stool: Able to stand independently and safely and complete 8 steps in 20 seconds Standing Unsupported, One Foot in Front: Able to place foot tandem independently and hold 30 seconds Standing on One Leg: Able to lift leg independently and hold 5-10 seconds Total Score: 55 Dynamic Gait Index Level Surface: Normal Change in Gait Speed: Normal Gait with Horizontal Head Turns: Normal Gait with Vertical Head Turns: Normal Gait and Pivot Turn: Normal Step Over Obstacle: Normal Step Around Obstacles: Normal Steps: Normal Total Score: 24       Pertinent Vitals/Pain Pain Assessment: Faces Faces Pain Scale: Hurts a little bit Pain Location: headache on Rt Pain Descriptors / Indicators:  Aching Pain Intervention(s): Limited activity within patient's tolerance;Monitored during session    [place] expects to be discharged to:: Private residence Living Arrangements: Spouse/significant other Available Help at Discharge: Family;Available 24 hours/day Type of Home: House                Prior Function Level  of Independence: Independent               Hand Dominance   Dominant Hand: Right    Extremity/Trunk Assessment   Upper Extremity Assessment: Defer to OT evaluation;Overall WFL for tasks assessed           Lower Extremity Assessment: Overall WFL for tasks assessed      Cervical / Trunk Assessment: Normal  Communication   Communication: No difficulties  Cognition Arousal/Alertness: Awake/alert Behavior During Therapy: WFL for tasks assessed/performed Overall Cognitive Status: Within Functional Limits for tasks assessed                      General Comments      Exercises        Assessment/Plan    PT Assessment [place] does not need any further PT services  PT Diagnosis Hemiplegia non-dominant side   PT Problem List    PT Treatment Interventions     PT Goals (Current goals can be found in the Care Plan section) Acute Rehab PT Goals Patient Stated Goal: return to exercising (stairs, treadmill) PT Goal Formulation: All assessment and education complete, DC therapy    Frequency     Barriers to discharge        Co-evaluation               End of Session Equipment Utilized During Treatment: Gait belt Activity Tolerance: Patient tolerated treatment well Patient left: in bed;with call bell/phone within reach;with family/visitor present (sit EOB visiting ) Nurse Communication: Mobility status         Time: [id number] PT Time Calculation (min) (ACUTE ONLY): 14 min   Charges:   PT Evaluation $PT Eval Low Complexity: 1 Procedure     PT G Codes:        Hannah Brady 06/24/2015, 11:03 AM Pager [phone]

## 2015-06-16 NOTE — Progress Notes (Signed)
    [facility] has been requested to perform a transesophageal echocardiogram on Hosie Spangle for stroke.  After careful review of history and examination, the risks and benefits of transesophageal echocardiogram have been explained including risks of esophageal damage, perforation (1:10,000 risk), bleeding, pharyngeal hematoma as well as other potential complications associated with conscious sedation including aspiration, arrhythmia, respiratory failure and death. Alternatives to treatment were discussed, questions were answered. Patient is willing to proceed.   Erlene Quan, PA 06/16/2015 2:52 PM

## 2015-06-16 NOTE — Progress Notes (Signed)
[facility] Progress Note  Patient: Hannah Brady [id number]   PCP: Irven Shelling, MD DOB: July 30, 1960   DOA: 06/15/2015   DOS: 06/16/2015   Date of Service: the patient was seen and examined on 06/16/2015  Subjective: The patient does not have any acute complaint. She mentions the weakness on the facial droop has resolved. Nutrition: To tolerate oral diet Activity: Ambulating in the hall Last BM: 06/15/2015  Assessment and Plan: 1. Acute CVA (cerebrovascular accident) [facility]) Presents with right facial droop and left sided weakness. Resolved currently. No TPA given since NIHSS was 0. Neurology consulting on the patient. CT angiogram shows no acute obstruction, MRI brain shows a diameter acute nonhemorrhagic infarct in the right centrum semiovale, also a punctate infarct in the right parietal lobe concerning for embolic phenomenon. Neurology recommends further workup including hypercoagulable panel, TEE, loop recorder. PTOT and speech therapy recommends no further therapy requirement. Continue with 325 minute, aspirin, added Lipitor 40 mg. Patient is on estrogen patch which was increased 2 months ago.  2. Headache. Neurology recommends tramadol.  DVT Prophylaxis: subcutaneous Heparin. Nutrition: Diet cardiac Advance goals of care discussion: Full code  Brief Summary of Hospitalization:  HPI: As per the H and P dictated on admission, "patient woke up from her sleep last night and felt that her left arm and leg were weak, she had a fall as she was not able to maintain balance. She also felt her right side of the face drooping and decided to come to the hospital" Daily update, Procedures: Echocardiogram 06/15/2015 EF 55-60%, no acute upper quantity. Scheduled for TEE 06/17/2015 Consultants: Neurology, cardiology for TEE and loop recorder Antibiotics: Anti-infectives    None     Family Communication: no family was present at bedside, at the time of interview.    Disposition:  Expected discharge date: 06/18/2015 Barriers to safe discharge: Stroke workup   Intake/Output Summary (Last 24 hours) at 06/16/15 1344 Last data filed at 06/16/15 1301  Gross per 24 hour  Intake   1320 ml  Output      0 ml  Net   1320 ml   Filed Weights   06/15/15 0428  Weight: 82.555 kg (182 lb)    Objective: Physical Exam: Filed Vitals:   06/16/15 0000 06/16/15 0300 06/16/15 0628 06/16/15 0936  BP: 134/66 138/68 110/73 140/69  Pulse: 68 71 62 78  Temp: 98 F (36.7 C) 98.4 F (36.9 C) 98.4 F (36.9 C) 98.4 F (36.9 C)  TempSrc: Oral Oral Oral Oral  Resp: 18 18 18    Height:      Weight:      SpO2: 100% 100% 98% 97%     General: Appear in no distress, no Rash; Oral Mucosa moist. Cardiovascular: S1 and S2 Present, no Murmur, no JVD Respiratory: Bilateral Air entry present and Clear to Auscultation, no Crackles, no wheezes Abdomen: Bowel Sound present, Soft and no tenderness Extremities: no Pedal edema, no calf tenderness Neurology: Grossly no focal neuro deficit.  Data Reviewed: CBC:  Recent Labs Lab 06/15/15 0445  WBC 7.5  NEUTROABS 4.1  HGB 14.8  HCT 42.4  MCV 86.9  PLT [id number]   Basic Metabolic Panel:  Recent Labs Lab 06/15/15 0445  NA 141  K 3.7  CL 106  CO2 23  GLUCOSE 123*  BUN 13  CREATININE 0.83  CALCIUM 9.3   Liver Function Tests:  Recent Labs Lab 06/15/15 0445  AST 19  ALT 23  ALKPHOS 43  BILITOT 0.7  PROT  6.2*  ALBUMIN 3.8   No results for input(s): LIPASE, AMYLASE in the last 168 hours. No results for input(s): AMMONIA in the last 168 hours.  Cardiac Enzymes:  Recent Labs Lab 06/15/15 0445  TROPONINI <0.03    BNP (last 3 results) No results for input(s): BNP in the last 8760 hours.  CBG:  Recent Labs Lab 06/15/15 0516  GLUCAP 114*    No results found for this or any previous visit (from the past 240 hour(s)).   Studies: No results found.   Scheduled Meds: . aspirin  300 mg Rectal Daily    Or  . aspirin  325 mg Oral Daily  . atorvastatin  40 mg Oral q1800  . enoxaparin (LOVENOX) injection  40 mg Subcutaneous Q24H  . sodium chloride  3 mL Intravenous Q12H   Continuous Infusions:  PRN Meds: sodium chloride, acetaminophen **OR** acetaminophen, hydrALAZINE, ondansetron **OR** ondansetron (ZOFRAN) IV, sodium chloride, traMADol  Time spent: 30 minutes  Author: Berle Mull, MD [facility] Pager: [phone] 06/16/2015 1:44 PM  If 7PM-7AM, please contact night-coverage at [email or web address], password [facility]

## 2015-06-16 NOTE — Evaluation (Signed)
Speech Language Pathology Evaluation Patient Details Name: Hannah Brady MRN: [id number] DOB: [birth year] Today's Date: 06/16/2015 Time: [id number] SLP Time Calculation (min) (ACUTE ONLY): 24 min  Problem List:  Patient Active Problem List   Diagnosis Date Noted  . TIA (transient ischemic attack) 06/15/2015  . Elevated blood pressure 06/15/2015  . Hyperglycemia 06/15/2015  . Headache, common migraine 06/15/2015  . Acute CVA (cerebrovascular accident) ([place]) 06/15/2015  . Nonintractable common migraine   . Left-sided weakness    Past Medical History: History reviewed. No pertinent past medical history. Past Surgical History: History reviewed. No pertinent past surgical history. HPI:  Pt is a 55 yo female who woke from sleep @3 :30 on  06/15/15 and fell out of bed due to L sided paralysis per pt, including LE,UE, and face. Slurred speech per husband. Last nml 18:30 on 06/14/15. By the time pt presented to ED sx had completely resolved. Only current complaint is a mild R sided HA which started during EMS transport to ED. No previous attacks. No alleviating or aggravating factors; pt stated she had recently increased estrogen patch dosage approximately 2 months prior, but otherwise, no significant pmhx; MRA head on 06/15/15 indicated an 8 mm acute nonhemorrhagic white matter infarct within the right centrum semiovale  Assessment / Plan / Recommendation Clinical Impression   Pt intelligible in complex conversational tasks; all auditory comprehension and verbal expression appear WFL during SLE as pt was able to follow complex directives, Ox4 and was able to give extensive information re: situation, medical history, etc. and all naming/repetition tasks were accurate as well. Pt states all symptoms have resolved at this time and cognitive functioning appears [facility] with complex tasks such as reasoning, problem-solving and STM tasks.    SLP Assessment  Patient does not need any further Speech Language  Pathology Services    Follow Up Recommendations  None    Frequency and Duration   n/a        SLP Evaluation Prior Functioning  Cognitive/Linguistic Baseline: Within functional limits Type of Home: House  Lives With: Spouse Available Help at Discharge: Family;Available 24 hours/day Education: Secretary/administrator school Vocation: Full time employment   Cognition  Overall Cognitive Status: Within Functional Limits for tasks assessed Arousal/Alertness: Awake/alert Orientation Level: Oriented X4 Safety/Judgment: Appears intact    Comprehension  Auditory Comprehension Overall Auditory Comprehension: Appears within functional limits for tasks assessed Yes/No Questions: Within Functional Limits Commands: Within Functional Limits Conversation: Complex Visual Recognition/Discrimination Discrimination: Within Function Limits Reading Comprehension Reading Status: Within funtional limits    Expression Expression Primary Mode of Expression: Verbal Verbal Expression Overall Verbal Expression: Appears within functional limits for tasks assessed Level of Generative/Spontaneous Verbalization: Conversation Repetition: No impairment Naming: No impairment Pragmatics: No impairment Non-Verbal Means of Communication: Not applicable Written Expression Dominant Hand: Right Written Expression: Not tested   Oral / Motor  Oral Motor/Sensory Function Overall Oral Motor/Sensory Function: Within functional limits Motor Speech Overall Motor Speech: Appears within functional limits for tasks assessed Respiration: Within functional limits Phonation: Normal Resonance: Within functional limits Articulation: Within functional limitis Intelligibility: Intelligible Motor Planning: Witnin functional limits Motor Speech Errors: Not applicable                       Caedence Snowden,PAT, M.S., CCC-SLP 06/16/2015, 10:17 AM

## 2015-06-16 NOTE — Progress Notes (Signed)
PT Cancellation Note  Patient Details Name: Hannah Brady MRN: [id number] DOB: 1960/06/11   Cancelled Treatment:    Reason Eval/Treat Not Completed: Patient at procedure or test/unavailable (pt in the shower). Unlikely PT eval is necessary. Will speak to patient when available and proceed as appropriate.   Shirly Bartosiewicz 06/16/2015, 9:18 AM  Pager [phone]

## 2015-06-16 NOTE — Progress Notes (Signed)
TEE information given. Consent signed. Patient to be NPO after midnight.  Ave Filter, RN

## 2015-06-17 ENCOUNTER — Encounter (HOSPITAL_COMMUNITY)
Admission: EM | Disposition: A | Discharge status: Home / Self Care | Payer: Self-pay | Source: Home / Self Care | Attending: Internal Medicine

## 2015-06-17 ENCOUNTER — Encounter (HOSPITAL_COMMUNITY): Payer: Self-pay | Admitting: Nurse Practitioner

## 2015-06-17 ENCOUNTER — Ambulatory Visit (HOSPITAL_COMMUNITY): Payer: 59

## 2015-06-17 DIAGNOSIS — I639 Cerebral infarction, unspecified: Secondary | ICD-10-CM

## 2015-06-17 DIAGNOSIS — I10 Essential (primary) hypertension: Secondary | ICD-10-CM

## 2015-06-17 HISTORY — PX: TEE WITHOUT CARDIOVERSION: SHX5443

## 2015-06-17 HISTORY — PX: EP IMPLANTABLE DEVICE: SHX172B

## 2015-06-17 LAB — CBC
HCT: 43.8 % (ref 36.0–46.0)
Hemoglobin: 15.1 g/dL — ABNORMAL HIGH (ref 12.0–15.0)
MCH: 30.4 pg (ref 26.0–34.0)
MCHC: 34.5 g/dL (ref 30.0–36.0)
MCV: 88.1 fL (ref 78.0–100.0)
PLATELETS: 205 10*3/uL (ref 150–400)
RBC: 4.97 MIL/uL (ref 3.87–5.11)
RDW: 13.7 % (ref 11.5–15.5)
WBC: 5.5 10*3/uL (ref 4.0–10.5)

## 2015-06-17 LAB — BASIC METABOLIC PANEL
Anion gap: 9 (ref 5–15)
BUN: 17 mg/dL (ref 6–20)
CALCIUM: 9.6 mg/dL (ref 8.9–10.3)
CHLORIDE: 105 mmol/L (ref 101–111)
CO2: 25 mmol/L (ref 22–32)
CREATININE: 0.83 mg/dL (ref 0.44–1.00)
GFR calc non Af Amer: >60 mL/min (ref >60)
GLUCOSE: 101 mg/dL — AB (ref 65–99)
Potassium: 4.7 mmol/L (ref 3.5–5.1)
Sodium: 139 mmol/L (ref 135–145)

## 2015-06-17 LAB — COMPLEMENT, TOTAL: Compl, Total (CH50): 25 U/mL — ABNORMAL LOW (ref 42–60)

## 2015-06-17 LAB — C3 COMPLEMENT: C3 COMPLEMENT: 92 mg/dL (ref 82–167)

## 2015-06-17 LAB — ANTINUCLEAR ANTIBODIES, IFA: ANTINUCLEAR ANTIBODIES, IFA: NEGATIVE

## 2015-06-17 LAB — HEMOGLOBIN A1C
HEMOGLOBIN A1C: 5.7 % — AB (ref 4.8–5.6)
MEAN PLASMA GLUCOSE: 117 mg/dL

## 2015-06-17 LAB — HIV ANTIBODY (ROUTINE TESTING W REFLEX): HIV SCREEN 4TH GENERATION: NONREACTIVE

## 2015-06-17 LAB — C4 COMPLEMENT: COMPLEMENT C4, BODY FLUID: 12 mg/dL — AB (ref 14–44)

## 2015-06-17 LAB — RPR: RPR: NONREACTIVE

## 2015-06-17 SURGERY — LOOP RECORDER INSERTION
Anesthesia: LOCAL

## 2015-06-17 SURGERY — ECHOCARDIOGRAM, TRANSESOPHAGEAL
Anesthesia: Moderate Sedation

## 2015-06-17 MED ORDER — ASPIRIN 325 MG PO TABS: 325.0000 mg | ORAL_TABLET | Freq: Every day | ORAL | Status: DC

## 2015-06-17 MED ORDER — LIDOCAINE VISCOUS 2 % MT SOLN
OROMUCOSAL | Status: DC | PRN
  Administered 2015-06-17: 20 mL via OROMUCOSAL

## 2015-06-17 MED ORDER — LIDOCAINE-EPINEPHRINE 1 %-1:100000 IJ SOLN
INTRAMUSCULAR | Status: AC
  Filled 2015-06-17: qty 1

## 2015-06-17 MED ORDER — MIDAZOLAM HCL 5 MG/ML IJ SOLN
INTRAMUSCULAR | Status: AC
  Filled 2015-06-17: qty 2

## 2015-06-17 MED ORDER — BUTAMBEN-TETRACAINE-BENZOCAINE 2-2-14 % EX AERO
INHALATION_SPRAY | CUTANEOUS | Status: DC | PRN
  Administered 2015-06-17: 1 via TOPICAL

## 2015-06-17 MED ORDER — LIDOCAINE-EPINEPHRINE 1 %-1:100000 IJ SOLN
INTRAMUSCULAR | Status: DC | PRN
  Administered 2015-06-17: 12 mL

## 2015-06-17 MED ORDER — DIPHENHYDRAMINE HCL 50 MG/ML IJ SOLN
INTRAMUSCULAR | Status: AC
  Filled 2015-06-17: qty 1

## 2015-06-17 MED ORDER — MIDAZOLAM HCL 10 MG/2ML IJ SOLN
INTRAMUSCULAR | Status: DC | PRN
  Administered 2015-06-17 (×2): 2 mg via INTRAVENOUS
  Administered 2015-06-17 (×2): 1 mg via INTRAVENOUS

## 2015-06-17 MED ORDER — FENTANYL CITRATE (PF) 100 MCG/2ML IJ SOLN
INTRAMUSCULAR | Status: AC
Start: 2015-06-17 — End: 2015-06-17
  Filled 2015-06-17: qty 4

## 2015-06-17 MED ORDER — LIDOCAINE VISCOUS 2 % MT SOLN
OROMUCOSAL | Status: AC
  Filled 2015-06-17: qty 15

## 2015-06-17 MED ORDER — ATORVASTATIN CALCIUM 40 MG PO TABS: 40.0000 mg | ORAL_TABLET | Freq: Every day | ORAL | Status: DC

## 2015-06-17 MED ORDER — FENTANYL CITRATE (PF) 100 MCG/2ML IJ SOLN
INTRAMUSCULAR | Status: DC | PRN
  Administered 2015-06-17 (×3): 25 ug via INTRAVENOUS

## 2015-06-17 MED FILL — ASPIRIN EC 325 MG TABLET: 325 | 60 days supply | Qty: 60 | Fill #0

## 2015-06-17 MED FILL — ATORVASTATIN 40 MG TABLET: 40 | 30 days supply | Qty: 30 | Fill #0

## 2015-06-17 SURGICAL SUPPLY — 2 items
LOOP REVEAL LINQSYS (Prosthesis & Implant Heart) ×1 IMPLANT
PACK LOOP INSERTION (CUSTOM PROCEDURE TRAY) ×2 IMPLANT

## 2015-06-17 NOTE — H&P (Signed)
     INTERVAL PROCEDURE H&P  History and Physical Interval Note:  06/17/2015 1:22 PM  Hannah Brady has presented today for their planned procedure. The various methods of treatment have been discussed with the patient and family. After consideration of risks, benefits and other options for treatment, the patient has consented to the procedure.  The patients' outpatient history has been reviewed, patient examined, and no change in status from most recent office note within the past 30 days. I have reviewed the patients' chart and labs and will proceed as planned. Questions were answered to the patient's satisfaction.   Pixie Casino, MD, [facility] Attending Cardiologist [place] 06/17/2015, 1:22 PM

## 2015-06-17 NOTE — CV Procedure (Signed)
TRANSESOPHAGEAL ECHOCARDIOGRAM (TEE) NOTE  INDICATIONS: stroke, source of embolus  PROCEDURE:   Informed consent was obtained prior to the procedure. The risks, benefits and alternatives for the procedure were discussed and the patient comprehended these risks.  Risks include, but are not limited to, cough, sore throat, vomiting, nausea, somnolence, esophageal and stomach trauma or perforation, bleeding, low blood pressure, aspiration, pneumonia, infection, trauma to the teeth and death.    After a procedural time-out, the patient was given 6 mg versed and 75 mcg fentanyl for moderate sedation.  The oropharynx was anesthetized 10 cc of topical 1% viscous lidocaine and 2 cetacaine sprays.  The transesophageal probe was inserted in the esophagus and stomach without difficulty and multiple views were obtained.  The patient was kept under observation until the patient left the procedure room.  The patient left the procedure room in stable condition.   Agitated microbubble saline contrast was administered.  COMPLICATIONS:    There were no immediate complications.  Findings:  1. LEFT VENTRICLE: The left ventricular wall thickness is normal.  The left ventricular cavity is normal in size. Wall motion is normal.  LVEF is 55-60%.  2. RIGHT VENTRICLE:  The right ventricle is normal in structure and function without any thrombus or masses.    3. LEFT ATRIUM:  The left atrium is normal in size without any thrombus or masses.  There is not spontaneous echo contrast ("smoke") in the left atrium consistent with a low flow state.  4. LEFT ATRIAL APPENDAGE:  The left atrial appendage is free of any thrombus or masses. The appendage has single lobes and has a brocolli shape. Pulse doppler indicates moderate flow in the appendage.  5. ATRIAL SEPTUM:  The atrial septum appears intact and is free of thrombus and/or masses.  There is no evidence for interatrial shunting by color doppler and saline  microbubble.  6. RIGHT ATRIUM:  The right atrium is normal in size and function without any thrombus or masses.  7. MITRAL VALVE:  The mitral valve is normal in structure and function with trace to mild regurgitation.  There were no vegetations or stenosis.  8. AORTIC VALVE:  The aortic valve is trileaflet, normal in structure and function with trivial central regurgitation. 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   9. TRICUSPID VALVE:  The tricuspid valve is normal in structure and function with trivial regurgitation.  There were no vegetations or stenosis  10.  PULMONIC VALVE:  The pulmonic valve is normal in structure and function with trivial regurgitation.  There were no vegetations or stenosis.   11. AORTIC ARCH, ASCENDING AND DESCENDING AORTA:  There was grade 1 Ron Parker et. 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  12. PULMONARY VEINS: Anomalous pulmonary venous return was not noted.  13. PERICARDIUM: The pericardium appeared normal and non-thickened.  There is no pericardial effusion.  IMPRESSION:   1. 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 2. No LAA thrombus 3. Negative for PFO 4. Trivial regurgitation of all 4 valves 5. Normal LV function  RECOMMENDATIONS:    1. 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 Recommended treatment is daily aspirin (she was not on this previously). 2. May still consider loop recorder  to detect occult a-fib.  Time Spent Directly with the Patient:  45 minutes   Pixie Casino, MD, [place] Attending Cardiologist [facility]  06/17/2015, 2:12 PM

## 2015-06-17 NOTE — Progress Notes (Signed)
ALL FOOD AND DRINK REMOVED FROM PT ROOM, PT NPO AT 0000 PER ORDER. PT V/U.

## 2015-06-17 NOTE — Discharge Summary (Signed)
PATIENT DETAILS Name: Hannah Brady Age: 55 y.o. Sex: female Date of Birth: 04/20/1961 MRN: [id number]. Admitting Physician: Norval Morton, MD [id number] Broadus John, MD  Admit Date: 06/15/2015 Discharge date: 06/17/2015  Recommendations for Outpatient Follow-up:  1. Hypercoagulable workup is pending-please follow  2. Repeat lipid panel in 3 months  3. New medication-aspirin 325 mg, Lipitor 40 mg.  PRIMARY DISCHARGE DIAGNOSIS:  Principal Problem:   Acute CVA (cerebrovascular accident) ([place]) Active Problems:   Elevated blood pressure   Hyperglycemia   Headache, common migraine      PAST MEDICAL HISTORY: Past Medical History  Diagnosis Date  . Stroke ([place])   . Hyperlipidemia   . Complication of anesthesia   . PONV (postoperative nausea and vomiting)     DISCHARGE MEDICATIONS: Current Discharge Medication List    START taking these medications   Details  aspirin 325 MG tablet Take 1 tablet (325 mg total) by mouth daily. Qty: 60 tablet, Refills: 0    atorvastatin (LIPITOR) 40 MG tablet Take 1 tablet (40 mg total) by mouth daily at 6 PM. Qty: 30 tablet, Refills: 0      CONTINUE these medications which have NOT CHANGED   Details  cholecalciferol (VITAMIN D) 1000 units tablet Take 1,000 Units by mouth daily.    diphenhydrAMINE (BENADRYL) 25 MG tablet Take 12.5 mg by mouth at bedtime as needed for sleep.    Multiple Vitamin (MULTIVITAMIN WITH MINERALS) TABS tablet Take 1 tablet by mouth daily.    OVER THE COUNTER MEDICATION Take 1 tablet by mouth daily. "it works"    saccharomyces boulardii (FLORASTOR) 250 MG capsule Take 250 mg by mouth daily.    vitamin C (ASCORBIC ACID) 500 MG tablet Take 500 mg by mouth daily.      STOP taking these medications     estradiol (CLIMARA - DOSED IN MG/24 HR) 0.075 mg/24hr patch        ALLERGIES:  No Known Allergies  BRIEF HPI:  See H&P, Labs, Consult and Test reports for all details in brief, patient was  presented to the hospital for evaluation of left-sided weakness, she was subsequently admitted for further evaluation and treatment  CONSULTATIONS:   cardiology and neurology  PERTINENT RADIOLOGIC STUDIES: Ct Angio Head W/cm &/or Wo Cm  06/15/2015  CLINICAL DATA:  TIA today. Left-sided weakness and facial droop. Acute white matter infarct in the right centrum semi ovale. EXAM: CT ANGIOGRAPHY HEAD AND NECK TECHNIQUE: Multidetector CT imaging of the head and neck was performed using the standard protocol during bolus administration of intravenous contrast. Multiplanar CT image reconstructions and MIPs were obtained to evaluate the vascular anatomy. Carotid stenosis measurements (when applicable) are obtained utilizing NASCET criteria, using the distal internal carotid diameter as the denominator. CONTRAST:  50 mL Omnipaque 350 COMPARISON:  MRI brain and MRA head from the same day. FINDINGS: CT HEAD Brain: Acute white matter infarct is not discretely visible by CT. The focal right parietal infarct is not well seen either. The basal ganglia are intact. No other focal insular lesions are present. No focal cortical lesions are evident. Calvarium and skull base: Negative Paranasal sinuses: Clear Orbits: Within normal limits. CTA NECK Aortic arch: A 3 vessel arch configuration is present. There is no focal stenosis at the origins the great vessels. Right carotid system: Right common carotid artery is within normal limits. Atherosclerotic irregularity is present at the bifurcation without a significant stenosis relative to the more distal vessel. The remainder of the cervical right  ICA is normal. Left carotid system: The left common carotid artery is within normal limits. Similar atherosclerotic changes are present with noncalcified plaque posteriorly at the left carotid bifurcation. There is no significant stenosis relative to the more distal vessel. The cervical left ICA is otherwise normal. Vertebral arteries:The  left vertebral artery is slightly dominant to the right. Both vertebral arteries originate from the subclavians without significant stenosis. There is no significant focal stenosis or vascular injury within the cervical vertebral arteries. Skeleton: Mild endplate degenerative changes and uncovertebral spurring is present, particularly at C5-6 and C6-7. Vertebral body heights and alignment are normal. Other neck: The soft tissues the neck are otherwise unremarkable. The lung apices demonstrate mild dependent atelectasis or edema bilaterally without focal nodule, mass, or airspace disease. The upper mediastinum is unremarkable. CTA HEAD Anterior circulation: They internal carotid arteries are within normal limits from the skullbase through the ICA termini bilaterally. The A1 and M1 segments are normal. The anterior communicating artery is patent. The MCA bifurcations are intact. There is mild narrowing of distal MCA branch vessels bilaterally without a significant proximal stenosis or occlusion. Posterior circulation: The PICA origins are visualized and normal bilaterally. The basilar artery is normal. Posterior communicating arteries contribute bilaterally with a fetal type right posterior cerebral artery and more equal contribution from a left P1 segment. The PCA branch vessels are unremarkable. Venous sinuses: The dural sinuses are patent bilaterally. The transverse sinuses are codominant. The straight sinus and deep cerebral veins are intact. Anatomic variants: Fetal type right posterior cerebral artery. Delayed phase: The postcontrast images demonstrate no pathologic enhancement. IMPRESSION: 1. Atherosclerotic changes with noncalcified plaque at the carotid bifurcations bilaterally. There is no significant stenosis relative to the distal vessels. 2. No other focal disease within the carotid system bilaterally. 3. Normal appearance of the vertebral arteries and posterior circulation. 4. The tiny infarcts are not  visible by CT. Electronically Signed   By: San Morelle M.D.   On: 06/15/2015 12:11   Ct Head Wo Contrast  06/15/2015  CLINICAL DATA:  Acute onset of slurred speech and facial droop. Initial encounter. EXAM: CT HEAD WITHOUT CONTRAST TECHNIQUE: Contiguous axial images were obtained from the base of the skull through the vertex without intravenous contrast. COMPARISON:  None. FINDINGS: There is no evidence of acute infarction, mass lesion, or intra- or extra-axial hemorrhage on CT. The posterior fossa, including the cerebellum, brainstem and fourth ventricle, is within normal limits. The third and lateral ventricles, and basal ganglia are unremarkable in appearance. The cerebral hemispheres are symmetric in appearance, with normal gray-white differentiation. No mass effect or midline shift is seen. There is no evidence of fracture; visualized osseous structures are unremarkable in appearance. The visualized portions of the orbits are within normal limits. The paranasal sinuses and mastoid air cells are well-aerated. No significant soft tissue abnormalities are seen. IMPRESSION: Unremarkable noncontrast CT of the head. Electronically Signed   By: Garald Balding M.D.   On: 06/15/2015 05:03   Ct Angio Neck W/cm &/or Wo/cm  06/15/2015  CLINICAL DATA:  TIA today. Left-sided weakness and facial droop. Acute white matter infarct in the right centrum semi ovale. EXAM: CT ANGIOGRAPHY HEAD AND NECK TECHNIQUE: Multidetector CT imaging of the head and neck was performed using the standard protocol during bolus administration of intravenous contrast. Multiplanar CT image reconstructions and MIPs were obtained to evaluate the vascular anatomy. Carotid stenosis measurements (when applicable) are obtained utilizing NASCET criteria, using the distal internal carotid diameter as the denominator.  CONTRAST:  50 mL Omnipaque 350 COMPARISON:  MRI brain and MRA head from the same day. FINDINGS: CT HEAD Brain: Acute white  matter infarct is not discretely visible by CT. The focal right parietal infarct is not well seen either. The basal ganglia are intact. No other focal insular lesions are present. No focal cortical lesions are evident. Calvarium and skull base: Negative Paranasal sinuses: Clear Orbits: Within normal limits. CTA NECK Aortic arch: A 3 vessel arch configuration is present. There is no focal stenosis at the origins the great vessels. Right carotid system: Right common carotid artery is within normal limits. Atherosclerotic irregularity is present at the bifurcation without a significant stenosis relative to the more distal vessel. The remainder of the cervical right ICA is normal. Left carotid system: The left common carotid artery is within normal limits. Similar atherosclerotic changes are present with noncalcified plaque posteriorly at the left carotid bifurcation. There is no significant stenosis relative to the more distal vessel. The cervical left ICA is otherwise normal. Vertebral arteries:The left vertebral artery is slightly dominant to the right. Both vertebral arteries originate from the subclavians without significant stenosis. There is no significant focal stenosis or vascular injury within the cervical vertebral arteries. Skeleton: Mild endplate degenerative changes and uncovertebral spurring is present, particularly at C5-6 and C6-7. Vertebral body heights and alignment are normal. Other neck: The soft tissues the neck are otherwise unremarkable. The lung apices demonstrate mild dependent atelectasis or edema bilaterally without focal nodule, mass, or airspace disease. The upper mediastinum is unremarkable. CTA HEAD Anterior circulation: They internal carotid arteries are within normal limits from the skullbase through the ICA termini bilaterally. The A1 and M1 segments are normal. The anterior communicating artery is patent. The MCA bifurcations are intact. There is mild narrowing of distal MCA branch  vessels bilaterally without a significant proximal stenosis or occlusion. Posterior circulation: The PICA origins are visualized and normal bilaterally. The basilar artery is normal. Posterior communicating arteries contribute bilaterally with a fetal type right posterior cerebral artery and more equal contribution from a left P1 segment. The PCA branch vessels are unremarkable. Venous sinuses: The dural sinuses are patent bilaterally. The transverse sinuses are codominant. The straight sinus and deep cerebral veins are intact. Anatomic variants: Fetal type right posterior cerebral artery. Delayed phase: The postcontrast images demonstrate no pathologic enhancement. IMPRESSION: 1. Atherosclerotic changes with noncalcified plaque at the carotid bifurcations bilaterally. There is no significant stenosis relative to the distal vessels. 2. No other focal disease within the carotid system bilaterally. 3. Normal appearance of the vertebral arteries and posterior circulation. 4. The tiny infarcts are not visible by CT. Electronically Signed   By: San Morelle M.D.   On: 06/15/2015 12:11   Mr Jodene Nam Head Wo Contrast  06/15/2015  CLINICAL DATA:  The patient awoke at 3:30 a.m., unable to move her left side and with left face paralysis. Slurred speech. Symptoms have since resolved. Right-sided headache. EXAM: MRI HEAD WITHOUT CONTRAST MRA HEAD WITHOUT CONTRAST TECHNIQUE: Multiplanar, multiecho pulse sequences of the brain and surrounding structures were obtained without intravenous contrast. Angiographic images of the head were obtained using MRA technique without contrast. COMPARISON:  CT head from the same day. FINDINGS: MRI HEAD FINDINGS An 8 mm focus of restricted diffusion is present in the right centrum semi of ally on image 30 of series 4. There is an additional punctate cortical lesion in the more posterior right parietal lobe on the same image. This second lesion can also be  seen on image 10 of series 8.  Subtle T2 changes present in the same area. No other significant white matter disease is present. The ventricles are of normal size. No significant extra-axial fluid collection is present. Flow is present in the major intracranial arteries. The globes and orbits are intact. The paranasal sinuses and mastoid air cells are clear. Skullbase is normal.  Midline sagittal images are unremarkable. MRA HEAD FINDINGS The internal carotid arteries are within normal limits from the high cervical segments through the ICA termini bilaterally. Posterior communicating arteries are present bilaterally, more prominent on the right. The A1 and M1 segments are normal. No definite anterior communicating artery is present. There is some attenuation of distal MCA branch vessels bilaterally. The vertebral arteries are codominant. The PICA origins are visualized and normal. The basilar artery is within normal limits. A fetal type right posterior cerebral artery is present. There is fecal contribution to the left PCA from a P1 segments and the posterior communicating artery. PCA branch vessels are intact. IMPRESSION: 1. 8 mm acute nonhemorrhagic white matter infarct within the right centrum semiovale. 2. Possible punctate cortical in the right parietal lobe as well. 3. No other significant white matter disease. 4. Mild distal MCA branch vessel attenuation. Question intracranial atherosclerosis versus vasculitis. These results were called by telephone at the time of interpretation on 06/15/2015 at 7:49 am to Dr. Vanita Panda , who verbally acknowledged these results. Electronically Signed   By: San Morelle M.D.   On: 06/15/2015 07:49   Mr Brain Wo Contrast  06/15/2015  CLINICAL DATA:  The patient awoke at 3:30 a.m., unable to move her left side and with left face paralysis. Slurred speech. Symptoms have since resolved. Right-sided headache. EXAM: MRI HEAD WITHOUT CONTRAST MRA HEAD WITHOUT CONTRAST TECHNIQUE: Multiplanar, multiecho  pulse sequences of the brain and surrounding structures were obtained without intravenous contrast. Angiographic images of the head were obtained using MRA technique without contrast. COMPARISON:  CT head from the same day. FINDINGS: MRI HEAD FINDINGS An 8 mm focus of restricted diffusion is present in the right centrum semi of ally on image 30 of series 4. There is an additional punctate cortical lesion in the more posterior right parietal lobe on the same image. This second lesion can also be seen on image 10 of series 8. Subtle T2 changes present in the same area. No other significant white matter disease is present. The ventricles are of normal size. No significant extra-axial fluid collection is present. Flow is present in the major intracranial arteries. The globes and orbits are intact. The paranasal sinuses and mastoid air cells are clear. Skullbase is normal.  Midline sagittal images are unremarkable. MRA HEAD FINDINGS The internal carotid arteries are within normal limits from the high cervical segments through the ICA termini bilaterally. Posterior communicating arteries are present bilaterally, more prominent on the right. The A1 and M1 segments are normal. No definite anterior communicating artery is present. There is some attenuation of distal MCA branch vessels bilaterally. The vertebral arteries are codominant. The PICA origins are visualized and normal. The basilar artery is within normal limits. A fetal type right posterior cerebral artery is present. There is fecal contribution to the left PCA from a P1 segments and the posterior communicating artery. PCA branch vessels are intact. IMPRESSION: 1. 8 mm acute nonhemorrhagic white matter infarct within the right centrum semiovale. 2. Possible punctate cortical in the right parietal lobe as well. 3. No other significant white matter disease. 4. Mild distal  MCA branch vessel attenuation. Question intracranial atherosclerosis versus vasculitis. These  results were called by telephone at the time of interpretation on 06/15/2015 at 7:49 am to Dr. Vanita Panda , who verbally acknowledged these results. Electronically Signed   By: San Morelle M.D.   On: 06/15/2015 07:49     PERTINENT LAB RESULTS: CBC:  Recent Labs  06/15/15 0445 06/17/15 0532  WBC 7.5 5.5  HGB 14.8 15.1*  HCT 42.4 43.8  PLT 194 205   CMET CMP     Component Value Date/Time   NA 139 06/17/2015 0532   K 4.7 06/17/2015 0532   CL 105 06/17/2015 0532   CO2 25 06/17/2015 0532   GLUCOSE 101* 06/17/2015 0532   BUN 17 06/17/2015 0532   CREATININE 0.83 06/17/2015 0532   CALCIUM 9.6 06/17/2015 0532   PROT 6.2* 06/15/2015 0445   ALBUMIN 3.8 06/15/2015 0445   AST 19 06/15/2015 0445   ALT 23 06/15/2015 0445   ALKPHOS 43 06/15/2015 0445   BILITOT 0.7 06/15/2015 0445   GFRNONAA >60 06/17/2015 0532   GFRAA >60 06/17/2015 0532    GFR Estimated Creatinine Clearance: 80.6 mL/min (by C-G formula based on Cr of 0.83). No results for input(s): LIPASE, AMYLASE in the last 72 hours.  Recent Labs  06/15/15 0445  TROPONINI <0.03   Invalid input(s): POCBNP No results for input(s): DDIMER in the last 72 hours.  Recent Labs  06/16/15 0559  HGBA1C 5.7*    Recent Labs  06/16/15 0559  CHOL 293*  HDL 52  LDLCALC 198*  TRIG 217*  CHOLHDL 5.6   No results for input(s): TSH, T4TOTAL, T3FREE, THYROIDAB in the last 72 hours.  Invalid input(s): FREET3 No results for input(s): VITAMINB12, FOLATE, FERRITIN, TIBC, IRON, RETICCTPCT in the last 72 hours. Coags: No results for input(s): INR in the last 72 hours.  Invalid input(s): PT Microbiology: No results found for this or any previous visit (from the past 240 hour(s)).   BRIEF HOSPITAL COURSE:   Principal Problem: Acute CVA (cerebrovascular accident): Presented with left-sided weakness-that has now resolved. MRI brain showed a right centrum semiovale infarct and a right parietal infarct. CT angiogram of the  neck did not show any significant stenosis. Once thoracic echocardiogram showed preserved EF without any embolic source. A1c 5.7, LDL was 198. 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 Neurology-felt that this was likely asymptomatic, recommended continue aspirin on discharge. Subsequently underwent a loop recorder placement. Statins will be continued on discharge-please recheck lipid panel in 3 months. Please note- hypercoagulable panel is pending-this will need to be followed by PCP.  Active Problems: Dyslipidemia: Continue statin  Please note-neurology recommends to discontinue estrogen therapy on discharge.   TODAY-DAY OF DISCHARGE:  Subjective:   Shemeika Zuehlke today has no headache,no chest abdominal pain,no new weakness tingling or numbness, feels much better wants to go home today.   Objective:   Blood pressure 131/74, pulse 76, temperature 97.9 F (36.6 C), temperature source Oral, resp. rate 15, height 5\' 4"  (1.626 m), weight 82.555 kg (182 lb), SpO2 97 %.  Intake/Output Summary (Last 24 hours) at 06/17/15 1530 Last data filed at 06/17/15 0800  Gross per 24 hour  Intake    123 ml  Output      0 ml  Net    123 ml   Filed Weights   06/15/15 0428  Weight:  82.555 kg (182 lb)    Exam Awake Alert, Oriented *3, No new F.N deficits, Normal affect [place].AT,PERRAL Supple Neck,No JVD, No cervical lymphadenopathy appriciated.  Symmetrical Chest wall movement, Good air movement bilaterally, CTAB RRR,No Gallops,Rubs or new Murmurs, No Parasternal Heave +ve B.Sounds, Abd Soft, Non tender, No organomegaly appriciated, No rebound -guarding or rigidity. No Cyanosis, Clubbing or edema, No new Rash or bruise  DISCHARGE CONDITION: Stable  DISPOSITION: Home  DISCHARGE INSTRUCTIONS:    Activity:  As tolerated   Get Medicines reviewed and adjusted: Please  take all your medications with you for your next visit with your Primary MD  Please request your Primary MD to go over all hospital tests and procedure/radiological results at the follow up, please ask your Primary MD to get all Hospital records sent to his/her office.  If you experience worsening of your admission symptoms, develop shortness of breath, life threatening emergency, suicidal or homicidal thoughts you must seek medical attention immediately by calling 911 or calling your MD immediately  if symptoms less severe.  You must read complete instructions/literature along with all the possible adverse reactions/side effects for all the Medicines you take and that have been prescribed to you. Take any new Medicines after you have completely understood and accpet all the possible adverse reactions/side effects.   Do not drive when taking Pain medications.   Do not take more than prescribed Pain, Sleep and Anxiety Medications  Special Instructions: If you have smoked or chewed Tobacco  in the last 2 yrs please stop smoking, stop any regular Alcohol  and or any Recreational drug use.  Wear Seat belts while driving.  Please note  You were cared for by a hospitalist during your hospital stay. Once you are discharged, your primary care physician will handle any further medical issues. Please note that NO REFILLS for any discharge medications will be authorized once you are discharged, as it is imperative that you return to your primary care physician (or establish a relationship with a primary care physician if you do not have one) for your aftercare needs so that they can reassess your need for medications and monitor your lab values.   Diet recommendation: Heart Healthy diet  Discharge Instructions    Ambulatory referral to Neurology    Complete by:  As directed   Please schedule post stroke follow up in 2 months.     Call MD for:  extreme fatigue    Complete by:  As directed      Call  MD for:  persistant dizziness or light-headedness    Complete by:  As directed      Diet - low sodium heart healthy    Complete by:  As directed      Increase activity slowly    Complete by:  As directed            Follow-up Information    Follow up with SETHI,PRAMOD, MD In 2 months.   Specialties:  Neurology, Radiology   Why:  Stroke Clinic, Office will call you with appointment date & time   Contact information:   [place] [address] [phone]       Follow up with [facility] On 06/25/2015.   Specialty:  Cardiology   Why:  at 12noon for wound check    Contact information:   [address], [place] [phone]      Follow up with [facility]  JOSEPH, MD. Schedule an appointment as soon as possible for a visit in 1 week.   Specialty:  Internal Medicine   Why:  Hospital follow up   Contact information:   [address] [place] [address] [phone]      Total Time spent on discharge equals  45 minutes.  SignedOren Binet 06/17/2015 3:30 PM

## 2015-06-17 NOTE — Consult Note (Signed)
ELECTROPHYSIOLOGY CONSULT NOTE  Patient ID: Hannah Brady MRN: [id number], DOB/AGE: [birth year]   Admit date: 06/15/2015 Date of Consult: 06/17/2015  Primary Physician: Irven Shelling, MD Primary Cardiologist: new to [facility] Reason for Consultation: Cryptogenic stroke; recommendations regarding Implantable Loop Recorder  History of Present Illness Hannah Brady was admitted on 06/15/2015 with left sided weakness and right facial droop. She first noticed symptoms when trying to turn off her alarm the morning of admission.  Her symptoms lasted for 10 minutes and then resolved. She presented to the ER for further evaluation.   Imaging demonstrated non-dominant right centrum semiovale infarct felt to be embolic secondary to unknown source.  She has undergone workup for stroke including echocardiogram and carotid dopplers.  The patient has been monitored on telemetry which has demonstrated sinus rhythm with no arrhythmias.  Inpatient stroke work-up is to be completed with a TEE.   Echocardiogram this admission demonstrated EF 55-60%, no RWMA, LA 40.  Lab work is reviewed.  Prior to admission, the patient denies chest pain, shortness of breath, dizziness, palpitations, or syncope.  They are recovering from their stroke with plans to return home at discharge.  EP has been asked to evaluate for placement of an implantable loop recorder to monitor for atrial fibrillation.   Past Medical History  Diagnosis Date  . Stroke ([place])   . Hyperlipidemia      Surgical History: History reviewed. No pertinent past surgical history.   Prescriptions prior to admission  Medication Sig Dispense Refill Last Dose  . cholecalciferol (VITAMIN D) 1000 units tablet Take 1,000 Units by mouth daily.   06/14/2015 at Unknown time  . diphenhydrAMINE (BENADRYL) 25 MG tablet Take 12.5 mg by mouth at bedtime as needed for sleep.   Past Week at Unknown time  . estradiol (CLIMARA - DOSED IN MG/24 HR)  0.075 mg/24hr patch Place 0.075 mg onto the skin once a week.   06/10/2015  . Multiple Vitamin (MULTIVITAMIN WITH MINERALS) TABS tablet Take 1 tablet by mouth daily.   06/14/2015 at Unknown time  . OVER THE COUNTER MEDICATION Take 1 tablet by mouth daily. "it works"   06/14/2015 at Unknown time  . saccharomyces boulardii (FLORASTOR) 250 MG capsule Take 250 mg by mouth daily.   06/14/2015 at Unknown time  . vitamin C (ASCORBIC ACID) 500 MG tablet Take 500 mg by mouth daily.   06/14/2015 at Unknown time    Inpatient Medications:  . aspirin  300 mg Rectal Daily   Or  . aspirin  325 mg Oral Daily  . atorvastatin  40 mg Oral q1800  . enoxaparin (LOVENOX) injection  40 mg Subcutaneous Q24H  . sodium chloride  3 mL Intravenous Q12H    Allergies: No Known Allergies  Social History   Social History  . Marital Status: Married    Spouse Name: N/A  . Number of Children: N/A  . Years of Education: N/A   Occupational History  . Not on file.   Social History Main Topics  . Smoking status: Never Smoker   . Smokeless tobacco: Not on file  . Alcohol Use: No  . Drug Use: No  . Sexual Activity: Not on file   Other Topics Concern  . Not on file   Social History Narrative     Family History  Problem Relation Age of Onset  . Diabetes Father       Review of Systems: All other systems reviewed and are otherwise negative except as  noted above.  Physical Exam: Filed Vitals:   06/16/15 1745 06/16/15 2139 06/17/15 0132 06/17/15 0630  BP: 114/59 97/45 102/57 114/64  Pulse: 70 62 64 64  Temp: 98.7 F (37.1 C) 98.1 F (36.7 C) 98.6 F (37 C) 98.5 F (36.9 C)  TempSrc: Oral Oral Oral Oral  Resp: 18 18 16 16   Height:      Weight:      SpO2: 98% 99% 98% 97%    GEN- The patient is well appearing, alert and oriented x 3 today.   Head- normocephalic, atraumatic Eyes-  Sclera clear, conjunctiva pink Ears- hearing intact Oropharynx- clear Neck- supple Lungs- Clear to ausculation  bilaterally, normal work of breathing Heart- Regular rate and rhythm  GI- soft, NT, ND, + BS Extremities- no clubbing, cyanosis, or edema MS- no significant deformity or atrophy Skin- no rash or lesion Psych- euthymic mood, full affect   Labs:   Lab Results  Component Value Date   WBC 5.5 06/17/2015   HGB 15.1* 06/17/2015   HCT 43.8 06/17/2015   MCV 88.1 06/17/2015   PLT 205 06/17/2015     Recent Labs Lab 06/15/15 0445 06/17/15 0532  NA 141 139  K 3.7 4.7  CL 106 105  CO2 23 25  BUN 13 17  CREATININE 0.83 0.83  CALCIUM 9.3 9.6  PROT 6.2*  --   BILITOT 0.7  --   ALKPHOS 43  --   ALT 23  --   AST 19  --   GLUCOSE 123* 101*     Radiology/Studies: Ct Angio Head W/cm &/or Wo Cm 06/15/2015  CLINICAL DATA:  TIA today. Left-sided weakness and facial droop. Acute white matter infarct in the right centrum semi ovale. EXAM: CT ANGIOGRAPHY HEAD AND NECK TECHNIQUE: Multidetector CT imaging of the head and neck was performed using the standard protocol during bolus administration of intravenous contrast. Multiplanar CT image reconstructions and MIPs were obtained to evaluate the vascular anatomy. Carotid stenosis measurements (when applicable) are obtained utilizing NASCET criteria, using the distal internal carotid diameter as the denominator. CONTRAST:  50 mL Omnipaque 350 COMPARISON:  MRI brain and MRA head from the same day. FINDINGS: CT HEAD Brain: Acute white matter infarct is not discretely visible by CT. The focal right parietal infarct is not well seen either. The basal ganglia are intact. No other focal insular lesions are present. No focal cortical lesions are evident. Calvarium and skull base: Negative Paranasal sinuses: Clear Orbits: Within normal limits. CTA NECK Aortic arch: A 3 vessel arch configuration is present. There is no focal stenosis at the origins the great vessels. Right carotid system: Right common carotid artery is within normal limits. Atherosclerotic irregularity  is present at the bifurcation without a significant stenosis relative to the more distal vessel. The remainder of the cervical right ICA is normal. Left carotid system: The left common carotid artery is within normal limits. Similar atherosclerotic changes are present with noncalcified plaque posteriorly at the left carotid bifurcation. There is no significant stenosis relative to the more distal vessel. The cervical left ICA is otherwise normal. Vertebral arteries:The left vertebral artery is slightly dominant to the right. Both vertebral arteries originate from the subclavians without significant stenosis. There is no significant focal stenosis or vascular injury within the cervical vertebral arteries. Skeleton: Mild endplate degenerative changes and uncovertebral spurring is present, particularly at C5-6 and C6-7. Vertebral body heights and alignment are normal. Other neck: The soft tissues the neck are otherwise unremarkable. The lung  apices demonstrate mild dependent atelectasis or edema bilaterally without focal nodule, mass, or airspace disease. The upper mediastinum is unremarkable. CTA HEAD Anterior circulation: They internal carotid arteries are within normal limits from the skullbase through the ICA termini bilaterally. The A1 and M1 segments are normal. The anterior communicating artery is patent. The MCA bifurcations are intact. There is mild narrowing of distal MCA branch vessels bilaterally without a significant proximal stenosis or occlusion. Posterior circulation: The PICA origins are visualized and normal bilaterally. The basilar artery is normal. Posterior communicating arteries contribute bilaterally with a fetal type right posterior cerebral artery and more equal contribution from a left P1 segment. The PCA branch vessels are unremarkable. Venous sinuses: The dural sinuses are patent bilaterally. The transverse sinuses are codominant. The straight sinus and deep cerebral veins are intact.  Anatomic variants: Fetal type right posterior cerebral artery. Delayed phase: The postcontrast images demonstrate no pathologic enhancement. IMPRESSION: 1. Atherosclerotic changes with noncalcified plaque at the carotid bifurcations bilaterally. There is no significant stenosis relative to the distal vessels. 2. No other focal disease within the carotid system bilaterally. 3. Normal appearance of the vertebral arteries and posterior circulation. 4. The tiny infarcts are not visible by CT. Electronically Signed   By: San Morelle M.D.   On: 06/15/2015 12:11   Mr Jodene Nam Head Wo Contrast 06/15/2015  CLINICAL DATA:  The patient awoke at 3:30 a.m., unable to move her left side and with left face paralysis. Slurred speech. Symptoms have since resolved. Right-sided headache. EXAM: MRI HEAD WITHOUT CONTRAST MRA HEAD WITHOUT CONTRAST TECHNIQUE: Multiplanar, multiecho pulse sequences of the brain and surrounding structures were obtained without intravenous contrast. Angiographic images of the head were obtained using MRA technique without contrast. COMPARISON:  CT head from the same day. FINDINGS: MRI HEAD FINDINGS An 8 mm focus of restricted diffusion is present in the right centrum semi of ally on image 30 of series 4. There is an additional punctate cortical lesion in the more posterior right parietal lobe on the same image. This second lesion can also be seen on image 10 of series 8. Subtle T2 changes present in the same area. No other significant white matter disease is present. The ventricles are of normal size. No significant extra-axial fluid collection is present. Flow is present in the major intracranial arteries. The globes and orbits are intact. The paranasal sinuses and mastoid air cells are clear. Skullbase is normal.  Midline sagittal images are unremarkable. MRA HEAD FINDINGS The internal carotid arteries are within normal limits from the high cervical segments through the ICA termini bilaterally.  Posterior communicating arteries are present bilaterally, more prominent on the right. The A1 and M1 segments are normal. No definite anterior communicating artery is present. There is some attenuation of distal MCA branch vessels bilaterally. The vertebral arteries are codominant. The PICA origins are visualized and normal. The basilar artery is within normal limits. A fetal type right posterior cerebral artery is present. There is fecal contribution to the left PCA from a P1 segments and the posterior communicating artery. PCA branch vessels are intact. IMPRESSION: 1. 8 mm acute nonhemorrhagic white matter infarct within the right centrum semiovale. 2. Possible punctate cortical in the right parietal lobe as well. 3. No other significant white matter disease. 4. Mild distal MCA branch vessel attenuation. Question intracranial atherosclerosis versus vasculitis. These results were called by telephone at the time of interpretation on 06/15/2015 at 7:49 am to Dr. Vanita Panda , who verbally acknowledged these results. Electronically  Signed   By: San Morelle M.D.   On: 06/15/2015 07:49   12-lead ECG sinus rhythm, rate 71, normal intervals   Telemetry sinus rhythm  Assessment and Plan:  1. Cryptogenic stroke The patient presents with cryptogenic stroke.  The patient has a TEE planned for this AM.  I spoke at length with the patient about monitoring for afib with either a 30 day event monitor or an implantable loop recorder.  Risks, benefits, and alteratives to implantable loop recorder were discussed with the patient today.   At this time, the patient is very clear in their decision to proceed with implantable loop recorder.   Wound care was reviewed with the patient (keep incision clean and dry for 3 days).  Wound check scheduled for 06/25/15 at 12Noon  Please call with questions.   Hannah Berthold, NP 06/17/2015 10:19 AM  Pt interviewed and examined and note addended as necessary   Prior hx of  palpitations increases the likelihood of finding afib Will proceed with ILR if TEE negative

## 2015-06-17 NOTE — Progress Notes (Signed)
Patient returned from TEE alert, denied pain at this time. No other distress noted. Will continue to monitor,   [name], RN.

## 2015-06-17 NOTE — Progress Notes (Signed)
Discharge instructions reviewed with patient/family. All questions answered at this time. Transport home by family.   Livie Vanderhoof, RN 

## 2015-06-17 NOTE — Progress Notes (Signed)
OT Cancellation Note  Patient Details Name: Hannah Brady MRN: [id number] DOB: [birth year]   Cancelled Treatment:    Reason Eval/Treat Not Completed: Patient at procedure or test/ unavailable. Pt out of room for TEE, will try back at later time as schedule allows.  Almon Register [id number] 06/17/2015, 11:46 AM

## 2015-06-17 NOTE — Progress Notes (Signed)
  Echocardiogram Echocardiogram Transesophageal has been performed.  Donata Clay 06/17/2015, 2:27 PM

## 2015-06-17 NOTE — Progress Notes (Signed)
*  Preliminary Results* Bilateral lower extremity venous duplex completed. Bilateral lower extremities are negative for deep vein thrombosis. There is no evidence of Baker's cyst bilaterally.  06/17/2015  Maudry Mayhew, RVT, RDCS, RDMS

## 2015-06-17 NOTE — Progress Notes (Signed)
STROKE TEAM PROGRESS NOTE   HISTORY OF PRESENT ILLNESS Hannah Brady is an 55 y.o. female with no past medical history. LSN 06/15/2015 at 0330 in AM. She went to get out of bed to turn her alarm off and noted she could not move her left side and had a right facial droop. Lasted for about 10 minutes and fully resolved. She cam to ED to get checked out. MRI confirms right brain CVA. Patient was not administered TPA secondary to NIHSS=0, delay in arrival. She was admitted for further evaluation and treatment.   SUBJECTIVE (INTERVAL HISTORY) No family is at the bedside.  Overall she feels her condition is completely resolved. She ia awaiting TEE and loop OBJECTIVE Temp:  [97.9 F (36.6 C)-98.7 F (37.1 C)] 97.9 F (36.6 C) (01/25 1534) Pulse Rate:  [62-111] 70 (01/25 1534) Cardiac Rhythm:  [-] Normal sinus rhythm (01/25 0748) Resp:  [13-24] 16 (01/25 1534) BP: (97-167)/(45-91) 121/71 mmHg (01/25 1534) SpO2:  [96 %-100 %] 96 % (01/25 1534)  CBC:   Recent Labs Lab 06/15/15 0445 06/17/15 0532  WBC 7.5 5.5  NEUTROABS 4.1  --   HGB 14.8 15.1*  HCT 42.4 43.8  MCV 86.9 88.1  PLT 194 034    Basic Metabolic Panel:   Recent Labs Lab 06/15/15 0445 06/17/15 0532  NA 141 139  K 3.7 4.7  CL 106 105  CO2 23 25  GLUCOSE 123* 101*  BUN 13 17  CREATININE 0.83 0.83  CALCIUM 9.3 9.6    Lipid Panel:     Component Value Date/Time   CHOL 293* 06/16/2015 0559   TRIG 217* 06/16/2015 0559   HDL 52 06/16/2015 0559   CHOLHDL 5.6 06/16/2015 0559   VLDL 43* 06/16/2015 0559   LDLCALC 198* 06/16/2015 0559   HgbA1c:  Lab Results  Component Value Date   HGBA1C 5.7* 06/16/2015   Urine Drug Screen:     Component Value Date/Time   LABOPIA NONE DETECTED 06/15/2015 0433   COCAINSCRNUR NONE DETECTED 06/15/2015 0433   LABBENZ NONE DETECTED 06/15/2015 0433   AMPHETMU NONE DETECTED 06/15/2015 0433   THCU NONE DETECTED 06/15/2015 0433   LABBARB NONE DETECTED 06/15/2015 0433       IMAGING  Ct Head Wo Contrast 06/15/2015   Unremarkable noncontrast CT of the head.   Ct Angio Head & Neck W/cm &/or Wo/cm 06/15/2015  1. Atherosclerotic changes with noncalcified plaque at the carotid bifurcations bilaterally. There is no significant stenosis relative to the distal vessels. 2. No other focal disease within the carotid system bilaterally. 3. Normal appearance of the vertebral arteries and posterior circulation. 4. The tiny infarcts are not visible by CT.   Mr Brain Wo Contrast 06/15/2015  1. 8 mm acute nonhemorrhagic white matter infarct within the right centrum semiovale. 2. Possible punctate cortical in the right parietal lobe as well. 3. No other significant white matter disease.   Mr Jodene Nam Head Wo Contrast 06/15/2015  4. Mild distal MCA branch vessel attenuation. Question intracranial atherosclerosis versus vasculitis.   2D Echocardiogram  - Left ventricle: The cavity size was normal. There was mild concentric hypertrophy. Systolic function was normal. The estimated ejection fraction was in the range of 55% to 60%. Wallmotion was normal; there were no regional wall motionabnormalities. Left ventricular diastolic function parameterswere normal. - Aortic valve: There was mild regurgitation. - Mitral valve: There was mild regurgitation. - Right ventricle: The cavity size was normal. Wall thickness wasnormal. Systolic function was normal. - Tricuspid valve:  There was no regurgitation.   PHYSICAL EXAM  Pleasant middle-aged lady currently not in distress. . Afebrile. Head is nontraumatic. Neck is supple without bruit.    Cardiac exam no murmur or gallop. Lungs are clear to auscultation. Distal pulses are well felt. Neurological Exam ; Awake alert oriented x 3 normal speech and language. Mild left lower face asymmetry. Tongue midline. No drift. Mild diminished fine finger movements on left. Orbits right over left upper extremity.  . Normal sensation . Normal coordination.:   ASSESSMENT/PLAN [name] is a 55 y.o. female with no significant past medical history presenting with transient left-sided weakness. She did not receive IV t-PA due to delay in arrival, NIH stroke scale = 0, resolved deficits.   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 Arranged with [place] for tomorrow.  (I have made patient NPO after midnight tonight).  Check bilateral lower extremity venous dopplers to rule out DVT as possible source of stroke.  If TEE negative, a [place] electrophysiologist will consult and consider placement of an implantable loop recorder to evaluate for atrial fibrillation as etiology of stroke. This has been explained to patient/family by Dr. Leonie Man and they are agreeable.  Given her young age, will check hypercoagulable tests (lupus anticoagulant, cardiolipin antibody) and vasculitic labs (C3, C4, CH50, ANA, ESR) as well as HIV and RPR as possible stroke sources.   LDL 198  HgbA1c 5.7  Lovenox 40 mg sq daily for VTE prophylaxis Diet - low sodium heart healthy Diet Heart Room service appropriate?: Yes; Fluid consistency:: Thin  No antithrombotic prior to admission, now on aspirin 325 mg daily  Patient counseled to be compliant with her antithrombotic medications  Ongoing aggressive stroke risk factor management  Therapy recommendations:  No therapy needs  Disposition:  Return home (lives w/ husband)  Hyperlipidemia  Home meds:  No statin  LDL 198, goal < 70  New lipitor 40 mg added  Continue statin at discharge  Headache  Secondary to stroke  Tylenol prn  Can give  tramadol if worsens  Other Active Problems  On estradiol .07 - > recommend no hormones. Pt understands.  Family hx of blood clots in their legs  Hospital day # [place] for Pager information 06/17/2015 3:47 PM  I have personally examined this patient, reviewed notes, independently viewed imaging studies, participated in medical decision making and plan of care. I have made any additions or clarifications directly to the above note. Agree with note above. She presented with embolic right middle cerebral artery infarct etiology to be determined. She remains at risk for neurological worsening, recurrent stroke, TIA needs ongoing stroke evaluation and aggressive risk factor modification. Recommend check TEE , hypercoagulable panel,and loop recorder insertion. Discussed with patient  and answered questions.  Antony Contras, MD Medical Director [place] Pager: [phone] 06/17/2015 3:47 PM     To contact Stroke Continuity provider, please refer to [email or web address] After hours, contact General Neurology

## 2015-06-18 ENCOUNTER — Telehealth: Payer: Self-pay | Admitting: Internal Medicine

## 2015-06-18 ENCOUNTER — Encounter (HOSPITAL_COMMUNITY): Payer: Self-pay | Admitting: Internal Medicine

## 2015-06-18 LAB — ANTIPHOSPHOLIPID SYNDROME EVAL, BLD
Anticardiolipin IgA: <9 APL U/mL (ref 0–11)
DRVVT: 33.6 s (ref 0.0–44.0)
PHOSPHATYDALSERINE, IGG: 2 {GPS'U} (ref 0–11)
PTT LA: 28.9 s (ref 0.0–40.6)
Phosphatydalserine, IgA: <1 APS IgA (ref 0–20)
Phosphatydalserine, IgM: 6 MPS IgM (ref 0–25)

## 2015-06-18 NOTE — Telephone Encounter (Signed)
Patient was admitted 06/15/15- 06/17/15 for cryptogenic stroke. LINQ was implanted 1/25 by Dr. Caryl Comes- will forward to Dr. Caryl Comes for review. She was discharged by the hospitalist.

## 2015-06-18 NOTE — Telephone Encounter (Signed)
New message     Pt had a loop recorder put in yesterday.  Need note to return to work.  Pt works at [employer].  Please call when ready and she will pick up the note.

## 2015-06-19 LAB — ALPHA GALACTOSIDASE: ALPHA GALACTOSIDASE, SERUM: 59.7 nmol/h/mg{prot} (ref 28.0–80.0)

## 2015-06-22 ENCOUNTER — Telehealth: Payer: Self-pay | Admitting: Internal Medicine

## 2015-06-22 LAB — PROTHROMBIN GENE MUTATION

## 2015-06-22 NOTE — Telephone Encounter (Signed)
New message    Patient calling C/O dressing changes issues . S/p 1/25 implant by Dr. Caryl Comes .

## 2015-06-22 NOTE — Telephone Encounter (Signed)
Patient states that she is concerned about taking a shower since her tegaderm/gauze dressing has lifted slightly at the bottom. She states that she believes there are steri strips under the dressing. I encouraged her to keep her back to the water as much as she can while taking a shower, but the change in her dressing should not be an issue with the incision. Patient voiced understanding. Plan to keep wound check appt as scheduled.

## 2015-06-23 ENCOUNTER — Telehealth: Payer: Self-pay | Admitting: Neurology

## 2015-06-23 NOTE — Telephone Encounter (Signed)
Pt called and was seen in hospital by Dr. Leonie Man, she is making an appt for hospital f/u but wants to know if she is able to go back to work? Please call and advise [phone]

## 2015-06-23 NOTE — Telephone Encounter (Signed)
Patient is neurologically cleared to return to work without restrictions but advised her to keep follow-up appointment

## 2015-06-23 NOTE — Telephone Encounter (Signed)
I called and spoke with the patient and advised her of Dr. Olin Pia recommendations that from a cardiac perspective, she may return to work, but that she should technically be cleared by neurology. She states she will call Dr. Clydene Fake office to follow up. She will come on 06/25/15 for her wound check.

## 2015-06-23 NOTE — Telephone Encounter (Signed)
Pt wants to know when is she supposed to return to work. She will need a release paper for her job.Pt says she will be fine returning to work on 06-29-15.

## 2015-06-23 NOTE — Telephone Encounter (Signed)
Her return to work is ok with me, but may be most appropriatgely decided by neuro

## 2015-06-24 DIAGNOSIS — I639 Cerebral infarction, unspecified: Secondary | ICD-10-CM | POA: Diagnosis not present

## 2015-06-24 DIAGNOSIS — E7800 Pure hypercholesterolemia, unspecified: Secondary | ICD-10-CM | POA: Diagnosis not present

## 2015-06-24 NOTE — Telephone Encounter (Signed)
LFt vm for patient that she can return to work from a neurologically standpoint without any restrictions.

## 2015-06-24 NOTE — Telephone Encounter (Signed)
Pt called said FMLA forms would need to be filled out and PCP said he would if Dr Leonie Man couldn't. I explained to her that she had not been seen at [facility] and PCP would need to f/u with her until her appt here. Pt understood and had no questions. FYI only

## 2015-06-25 ENCOUNTER — Other Ambulatory Visit: Payer: Self-pay

## 2015-06-25 ENCOUNTER — Encounter: Payer: Self-pay | Admitting: Internal Medicine

## 2015-06-25 ENCOUNTER — Ambulatory Visit (INDEPENDENT_AMBULATORY_CARE_PROVIDER_SITE_OTHER): Payer: 59 | Admitting: *Deleted

## 2015-06-25 DIAGNOSIS — Z4509 Encounter for adjustment and management of other cardiac device: Secondary | ICD-10-CM

## 2015-06-25 LAB — CUP PACEART INCLINIC DEVICE CHECK: Date Time Interrogation Session: 20170202124218

## 2015-06-25 NOTE — Progress Notes (Signed)
Loop wound check in clinic. Steri-strips removed, wound well healed. Edges approximated, no redness, swelling or drainage noted. Patient instructed about wound care and carelink monitor. Battery status: good. R-waves 0.20mV. No episodes. Monthly Carelink summary reports, ROV with SK in 12 months.

## 2015-06-25 NOTE — Patient Outreach (Addendum)
[facility]) Care Management  06/25/2015  MALAI MURRAH [birth year] [id number]  [facility] Stroke Program   Outreach call #1 to patient.  Patient not reached.  RN CM left HIPAA compliant voice message with name and number.   RN CM scheduled for next outreach call within one week.    [name], RN, BSN, [facility], CCM  [employer] Management Coordinator [phone] Direct [phone] Cell [phone] Office [phone] Fax

## 2015-06-26 ENCOUNTER — Ambulatory Visit: Payer: Self-pay

## 2015-06-29 ENCOUNTER — Other Ambulatory Visit: Payer: Self-pay

## 2015-06-29 NOTE — Patient Outreach (Signed)
[facility]) Care Management  06/29/2015  Hannah Brady 10/06/1960 [id number]  Referral date:  06/22/15 Issue:  [facility] Stroke Program  Providers: Primary MD: Dr. Laurann Montana -  last appt:  Yes  next appt:  2 weeks  Neurologist:  Dr. Leonie Man 08/2015 HH: none  Insurance: [employer] Employee   Social: Mobility: Ambulates with no assistive devices.  Returned back to work today 06/29/2015 per Dr. Delene Ruffini approval.  Falls: None  Transportation: Self  DME: none   THN conditions:  [place] Stroke Program Admissions: 1 06/15/2015 - 06/17/2015  CVA ER visits: 0 Hyperlipidemia - Patient to return to PCP in 2 weeks for labs.    Medications:  Patient taking less than 10 medications  Co-pay cost issues:  None  Flu Vaccine: 02/09/2015   Plan:  Referral Date:06/22/15 Telephonic Care Management services for [facility] Stroke Program:  06/29/2015  Stroke RN CM reminded to review [facility] Stroke Recovery Book provided on discharge date.   RN CM discussed secondary stroke prevention.  [facility] Education mailed 06/29/2015  -What is Stroke? -Signs of Stroke -Preventing a Second Stroke:  Know The Signs of Stroke -Preventing Stroke and TIA -What You Can Do to Prevent a Second Stroke  RN CM scheduled next contact call within one week for [facility] Stroke Week 2.   RN CM advised to please notify MD of any changes in condition prior to scheduled appt's.   RN CM provided contact name and # [phone] or main office # [phone] and 24-hour nurse line # [phone].  RN CM confirmed patient is aware of 911 services for urgent emergency needs.  [name], RN, BSN, [facility], CCM  [employer] Management Coordinator [phone] Direct [phone] Cell [phone] Office [phone] Fax

## 2015-07-06 ENCOUNTER — Other Ambulatory Visit: Payer: Self-pay

## 2015-07-06 NOTE — Patient Outreach (Signed)
[facility]) Care Management  07/06/2015  Hannah Brady 1961/01/06 [id number]   [facility] Stroke Program  Outreach call #1 to patient.  Patient not reached.  RN CM left HIPAA compliant voice message with contact name and contact #.  RN CM scheduled for next outreach call within one week.   [name], RN, BSN, [facility], CCM  [employer] Management Coordinator [phone] Direct [phone] Cell [phone] Office [phone] Fax

## 2015-07-07 ENCOUNTER — Other Ambulatory Visit: Payer: Self-pay

## 2015-07-07 NOTE — Patient Outreach (Signed)
[facility]) Care Management  07/07/2015  Hannah Brady [birth year] [id number]   [facility] Stroke Program  Outreach call #2 to patient. Patient not reached.  RN CM left HIPAA compliant voice message with contact name and contact #.  RN CM scheduled for next outreach call within one week.   [name], RN, BSN, [facility], CCM  [employer] Management Coordinator [phone] Direct [phone] Cell [phone] Office [phone] Fax

## 2015-07-08 ENCOUNTER — Other Ambulatory Visit: Payer: Self-pay

## 2015-07-08 NOTE — Patient Outreach (Signed)
[facility]) Care Management  07/08/2015  Hannah Brady Nov 19, 1960 [id number]   Insurance:  [employer] Employee (Correction:  [employer] Employee)  [name], RN, BSN, [facility], CCM  [employer] [phone] Direct [phone] Cell [phone] Office [phone] Fax

## 2015-07-08 NOTE — Patient Outreach (Signed)
[place] [facility]) Care Management  07/08/2015  Hannah Brady Jun 23, 1960 [id number]  Inbound voice message received from patient stating "I received your voice message.  I do not get off work until around 2-2:30pm; please attempt call back after 2:30pm.  Outreach call #3 to patient at 3:10pm.  Patient not reached.  RN CM left HIPAA compliant voice message with name and contact #.  Unsuccessful outreach letter sent.  RN CM will review case within 2 weeks for response and close if no response.   [name], RN, BSN, [facility], CCM  [employer] Management Coordinator [phone] Direct [phone] Cell [phone] Office [phone] Fax

## 2015-07-13 DIAGNOSIS — E7800 Pure hypercholesterolemia, unspecified: Secondary | ICD-10-CM | POA: Diagnosis not present

## 2015-07-15 MED FILL — ATORVASTATIN 40 MG TABLET: 40 | 30 days supply | Qty: 30 | Fill #0

## 2015-07-17 ENCOUNTER — Ambulatory Visit (INDEPENDENT_AMBULATORY_CARE_PROVIDER_SITE_OTHER): Payer: 59 | Admitting: *Deleted

## 2015-07-17 DIAGNOSIS — Z4509 Encounter for adjustment and management of other cardiac device: Secondary | ICD-10-CM

## 2015-07-17 DIAGNOSIS — I639 Cerebral infarction, unspecified: Secondary | ICD-10-CM

## 2015-07-21 NOTE — Progress Notes (Signed)
Carelink Summary Report / Loop Recorder 

## 2015-07-22 ENCOUNTER — Other Ambulatory Visit: Payer: Self-pay

## 2015-07-22 DIAGNOSIS — E7800 Pure hypercholesterolemia, unspecified: Secondary | ICD-10-CM | POA: Diagnosis not present

## 2015-07-22 DIAGNOSIS — N951 Menopausal and female climacteric states: Secondary | ICD-10-CM | POA: Diagnosis not present

## 2015-07-22 DIAGNOSIS — Z8673 Personal history of transient ischemic attack (TIA), and cerebral infarction without residual deficits: Secondary | ICD-10-CM | POA: Diagnosis not present

## 2015-07-22 MED FILL — BRISDELLE 7.5 MG CAPSULE: 7.5 | 30 days supply | Qty: 30 | Fill #0

## 2015-07-22 NOTE — Patient Outreach (Signed)
[facility]) Care Management  07/22/2015  Hannah Brady 1961/05/09 [id number]  [facility] Stroke Program  Case closure:  Established patient; no longer able to reach following several phone attempts and unsuccessful outreach letter.   RN CM notified [name].   RN CM sent case closure letter to primary, Dr. Laurann Montana and Neurologist, Dr. Leonie Man.   [name], RN, BSN, [facility], CCM  [employer] Management Coordinator [phone] Direct [phone] Cell [phone] Office [phone] Fax

## 2015-08-05 LAB — CUP PACEART REMOTE DEVICE CHECK: Date Time Interrogation Session: 20170224193607

## 2015-08-05 NOTE — Progress Notes (Signed)
Carelink summary report received. Battery status OK. Normal device function. No new symptom episodes, tachy episodes, brady, or pause episodes. No new AF episodes. Monthly summary reports and ROV/PRN 

## 2015-08-10 MED FILL — ATORVASTATIN 40 MG TABLET: 40 | 90 days supply | Qty: 90 | Fill #1

## 2015-08-11 MED FILL — ASPIRIN EC 325 MG TABLET: 325 | 90 days supply | Qty: 90 | Fill #0

## 2015-08-12 MED FILL — BRISDELLE 7.5 MG CAPSULE: 7.5 | 30 days supply | Qty: 30 | Fill #1

## 2015-08-17 ENCOUNTER — Ambulatory Visit (INDEPENDENT_AMBULATORY_CARE_PROVIDER_SITE_OTHER): Payer: 59 | Admitting: *Deleted

## 2015-08-17 DIAGNOSIS — I639 Cerebral infarction, unspecified: Secondary | ICD-10-CM | POA: Diagnosis not present

## 2015-08-17 NOTE — Progress Notes (Signed)
Carelink Summary Report / Loop Recorder 

## 2015-08-25 DIAGNOSIS — E7800 Pure hypercholesterolemia, unspecified: Secondary | ICD-10-CM | POA: Diagnosis not present

## 2015-08-25 DIAGNOSIS — Z8673 Personal history of transient ischemic attack (TIA), and cerebral infarction without residual deficits: Secondary | ICD-10-CM | POA: Diagnosis not present

## 2015-09-01 ENCOUNTER — Encounter: Payer: Self-pay | Admitting: Neurology

## 2015-09-01 ENCOUNTER — Ambulatory Visit (INDEPENDENT_AMBULATORY_CARE_PROVIDER_SITE_OTHER): Payer: 59 | Admitting: Neurology

## 2015-09-01 VITALS — BP 119/77 | HR 58 | Ht 64.0 in | Wt 181.8 lb

## 2015-09-01 DIAGNOSIS — I639 Cerebral infarction, unspecified: Secondary | ICD-10-CM | POA: Diagnosis not present

## 2015-09-01 NOTE — Progress Notes (Signed)
[facility] [address] [place]. [place] [address] [phone]       OFFICE FOLLOW-UP NOTE  [name] Date of Birth:  1961-03-08 Medical Record Number:  [id number]   HPI: 36 year lady seen today for first office follow-up visit for hospital admission for stroke on 06/15/2015. Hannah Brady is an 55 y.o. female with no past medical history. LSN 06/15/2015 at 0330 in AM. She went to get out of bed to turn her alarm off and noted she could not move her left side and had a right facial droop. Lasted for about 10 minutes and fully resolved. She cam to ED to get checked out. MRI confirmed right brain CVA. Patient was not administered TPA secondary to NIHSS=0, delay in arrival. She was admitted for further evaluation and treatment. CT angiogram of the brain showed mild atherosclerotic changes at carotid bifurcations but no significant large vessel stenosis. Transthoracic echo showed normal ejection fraction. 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 which was not felt to be a source of embolism. LDL cholesterol was elevated 198 for which she'll started on Lipitor. Hemoglobin A1c was 5.7. Patient was felt to look cryptogenic stroke of embolic etiology and underwent loop recorder insertion and so far proximal as the atrial fibrillation has not yet been detected. He states is tolerating aspirin well without bleeding or bruising. Blood pressure is well controlled and it is 119/77 today. She is tolerating Lipitor well without side effects. She had follow-up lipid profile checked 2 weeks ago and it was satisfactory. She complains of intermittent tingling and numbness in her hands particularly at night. She does admit that she often sleeps with her hand beneath her head. She also works and has to do a lot of typing. Her  primary physician feels this may be mild carpal tunnel and she plans to avoid activities with rapid repetitive wrist flexion and wear a wrist extension splint.  ROS:   14 system review of systems is positive for  no complaints today. PMH:  Past Medical History  Diagnosis Date  . Stroke ([place])   . Hyperlipidemia   . Complication of anesthesia   . PONV (postoperative nausea and vomiting)     Social History:  Social History   Social History  . Marital Status: Married    Spouse Name: N/A  . Number of Children: N/A  . Years of Education: N/A   Occupational History  . Not on file.   Social History Main Topics  . Smoking status: Never Smoker   . Smokeless tobacco: Not on file  . Alcohol Use: Yes     Comment: socially  . Drug Use: No  . Sexual Activity: Not on file   Other Topics Concern  . Not on file   Social History Narrative    Medications:   Current Outpatient Prescriptions on File Prior to Visit  Medication Sig Dispense Refill  . aspirin 325 MG tablet Take 1 tablet (325 mg total) by mouth daily. 60 tablet 0  . atorvastatin (LIPITOR) 40 MG tablet Take 1 tablet (40 mg total) by mouth daily at 6 PM. 30 tablet 0  . Multiple Vitamin (MULTIVITAMIN WITH MINERALS) TABS tablet Take 1 tablet by mouth daily.     No current facility-administered medications on file prior to visit.    Allergies:  No Known Allergies  Physical Exam General:  well developed, well nourished middle aged lady, seated, in no evident distress Head: head normocephalic and atraumatic.  Neck: supple with no carotid or supraclavicular bruits Cardiovascular: regular rate and rhythm, no murmurs Musculoskeletal: no deformity Skin:  no rash/petichiae Vascular:  Normal pulses all extremities Filed Vitals:   09/01/15 0824  BP: 119/77  Pulse: 58   Neurologic Exam Mental Status: Awake and fully alert. Oriented to place and time. Recent and remote memory intact. Attention span, concentration and fund of  knowledge appropriate. Mood and affect appropriate.  Cranial Nerves: Fundoscopic exam reveals sharp disc margins. Pupils equal, briskly reactive to light. Extraocular movements full without nystagmus. Visual fields full to confrontation. Hearing intact. Facial sensation intact. Face, tongue, palate moves normally and symmetrically.  Motor: Normal bulk and tone. Normal strength in all tested extremity muscles. Sensory.: intact to touch ,pinprick .position and vibratory sensation.  Coordination: Rapid alternating movements normal in all extremities. Finger-to-nose and heel-to-shin performed accurately bilaterally. Gait and Station: Arises from chair without difficulty. Stance is normal. Gait demonstrates normal stride length and balance . Able to heel, toe and tandem walk without difficulty.  Reflexes: 1+ and symmetric. Toes downgoing.   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    PLAN: I had a long d/w patient about her recent cryptogenic stroke, risk for recurrent stroke/TIAs, personally independently reviewed imaging studies and stroke evaluation results and answered questions.Continue aspirin 325 mg daily  for secondary stroke prevention and maintain strict control of hypertension with blood pressure goal below 130/90,  and lipids with LDL cholesterol goal below 70 mg/dL. I also advised the patient to eat a healthy diet with plenty of whole grains, cereals, fruits and vegetables, exercise regularly and maintain ideal body weight. I also discussed with her possible participation in the RESPECT ESUS trial but patient is not interested Greater than 50% of time during this 25 minute visit was spent on counseling,explanation of diagnosis, planning of further management, discussion with patient and family and coordination of care .Followup in the future  with stroke nurse practitioner in 6 months or call earlier if necessary. Antony Contras, MD  Note: This document was prepared with digital dictation and possible smart phrase technology. Any transcriptional errors that result from this process are unintentional

## 2015-09-01 NOTE — Patient Instructions (Signed)
I had a long d/w patient about her recent cryptogenic stroke, risk for recurrent stroke/TIAs, personally independently reviewed imaging studies and stroke evaluation results and answered questions.Continue aspirin 325 mg daily  for secondary stroke prevention and maintain strict control of hypertension with blood pressure goal below 130/90,  and lipids with LDL cholesterol goal below 70 mg/dL. I also advised the patient to eat a healthy diet with plenty of whole grains, cereals, fruits and vegetables, exercise regularly and maintain ideal body weight Followup in the future with stroke nurse practitioner in 6 months or call earlier if necessary. Stroke Prevention Some medical conditions and behaviors are associated with an increased chance of having a stroke. You may prevent a stroke by making healthy choices and managing medical conditions. HOW CAN I REDUCE MY RISK OF HAVING A STROKE?   Stay physically active. Get at least 30 minutes of activity on most or all days.  Do not smoke. It may also be helpful to avoid exposure to secondhand smoke.  Limit alcohol use. Moderate alcohol use is considered to be:  No more than 2 drinks per day for men.  No more than 1 drink per day for nonpregnant women.  Eat healthy foods. This involves:  Eating 5 or more servings of fruits and vegetables a day.  Making dietary changes that address high blood pressure (hypertension), high cholesterol, diabetes, or obesity.  Manage your cholesterol levels.  Making food choices that are high in fiber and low in saturated fat, trans fat, and cholesterol may control cholesterol levels.  Take any prescribed medicines to control cholesterol as directed by your health care provider.  Manage your diabetes.  Controlling your carbohydrate and sugar intake is recommended to manage diabetes.  Take any prescribed medicines to control diabetes as directed by your health care provider.  Control your hypertension.  Making  food choices that are low in salt (sodium), saturated fat, trans fat, and cholesterol is recommended to manage hypertension.  Ask your health care provider if you need treatment to lower your blood pressure. Take any prescribed medicines to control hypertension as directed by your health care provider.  If you are 46-24 years of age, have your blood pressure checked every 3-5 years. If you are 29 years of age or older, have your blood pressure checked every year.  Maintain a healthy weight.  Reducing calorie intake and making food choices that are low in sodium, saturated fat, trans fat, and cholesterol are recommended to manage weight.  Stop drug abuse.  Avoid taking birth control pills.  Talk to your health care provider about the risks of taking birth control pills if you are over 27 years old, smoke, get migraines, or have ever had a blood clot.  Get evaluated for sleep disorders (sleep apnea).  Talk to your health care provider about getting a sleep evaluation if you snore a lot or have excessive sleepiness.  Take medicines only as directed by your health care provider.  For some people, aspirin or blood thinners (anticoagulants) are helpful in reducing the risk of forming abnormal blood clots that can lead to stroke. If you have the irregular heart rhythm of atrial fibrillation, you should be on a blood thinner unless there is a good reason you cannot take them.  Understand all your medicine instructions.  Make sure that other conditions (such as anemia or atherosclerosis) are addressed. SEEK IMMEDIATE MEDICAL CARE IF:   You have sudden weakness or numbness of the face, arm, or leg, especially on  one side of the body.  Your face or eyelid droops to one side.  You have sudden confusion.  You have trouble speaking (aphasia) or understanding.  You have sudden trouble seeing in one or both eyes.  You have sudden trouble walking.  You have dizziness.  You have a loss of  balance or coordination.  You have a sudden, severe headache with no known cause.  You have new chest pain or an irregular heartbeat. Any of these symptoms may represent a serious problem that is an emergency. Do not wait to see if the symptoms will go away. Get medical help at once. Call your local emergency services (911 in [place] Do not drive yourself to the hospital.   This information is not intended to replace advice given to you by your health care provider. Make sure you discuss any questions you have with your health care provider.   Document Released: 06/16/2004 Document Revised: 05/30/2014 Document Reviewed: 11/09/2012 Elsevier Interactive Patient Education [employer].

## 2015-09-03 DIAGNOSIS — N951 Menopausal and female climacteric states: Secondary | ICD-10-CM | POA: Diagnosis not present

## 2015-09-15 ENCOUNTER — Ambulatory Visit (INDEPENDENT_AMBULATORY_CARE_PROVIDER_SITE_OTHER): Payer: 59 | Admitting: *Deleted

## 2015-09-15 DIAGNOSIS — I639 Cerebral infarction, unspecified: Secondary | ICD-10-CM

## 2015-09-16 NOTE — Progress Notes (Signed)
Carelink Summary Report / Loop Recorder 

## 2015-09-21 MED FILL — VENLAFAXINE HCL ER 37.5 MG: 37.5 | 30 days supply | Qty: 60 | Fill #0

## 2015-10-05 ENCOUNTER — Other Ambulatory Visit: Payer: Self-pay | Admitting: *Deleted

## 2015-10-05 NOTE — Patient Outreach (Signed)
First attempt for high cost referral outreach.  Summarized EPIC note after discharge from hospital on 1/25 after admission on 06/15/15. 06/15/15 acute CVA (cerebrovascular accident): Presented with left-sided weakness-that has now resolved. MRI brain showed a right centrum semiovale infarct and a right parietal infarct. CT angiogram of the neck did not show any significant stenosis. Once thoracic echocardiogram showed preserved EF without any embolic source. A1c 5.7, LDL was 198. 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 Neurology-felt that this was likely asymptomatic, recommended continue aspirin on discharge. Subsequently underwent a loop recorder placement. Statins will be continued on discharge-please recheck lipid panel in 3 months. Please note- hypercoagulable panel is pending-this will need to be followed by PCP. Active Problems: Dyslipidemia: Continue statin neurology recommends to discontinue estrogen therapy on discharge.   Left message on member's mobile number requesting return call to screen and discuss case management services. Will attempt second outreach at a later date. Barrington Ellison RN,CCM,CDE [place] Management Coordinator Link To Wellness Office Phone [phone] Office Fax [phone]

## 2015-10-15 ENCOUNTER — Ambulatory Visit (INDEPENDENT_AMBULATORY_CARE_PROVIDER_SITE_OTHER): Payer: 59 | Admitting: *Deleted

## 2015-10-15 DIAGNOSIS — I639 Cerebral infarction, unspecified: Secondary | ICD-10-CM

## 2015-10-16 NOTE — Progress Notes (Signed)
Carelink Summary Report / Loop Recorder 

## 2015-10-17 LAB — CUP PACEART REMOTE DEVICE CHECK: MDC IDC SESS DTM: 20170326193715

## 2015-10-17 NOTE — Progress Notes (Signed)
Carelink summary report received. Battery status OK. Normal device function. No new symptom episodes, tachy episodes, brady, or pause episodes. No new AF episodes. Monthly summary reports and ROV/PRN 

## 2015-10-19 LAB — CUP PACEART REMOTE DEVICE CHECK: MDC IDC SESS DTM: 20170425200646

## 2015-10-19 NOTE — Progress Notes (Signed)
Carelink summary report received. Battery status OK. Normal device function. No new symptom episodes, tachy episodes, brady, or pause episodes. No new AF episodes. Monthly summary reports and ROV/PRN 

## 2015-10-20 MED FILL — VENLAFAXINE HCL ER 75 MG CA: 75 | 30 days supply | Qty: 30 | Fill #0

## 2015-10-21 ENCOUNTER — Other Ambulatory Visit: Payer: Self-pay | Admitting: *Deleted

## 2015-10-21 NOTE — Patient Outreach (Signed)
Second attempt for high cost referral outreach.  Summarized EPIC note after discharge from hospital on 1/25 after admission on 06/15/15. 06/15/15 acute CVA (cerebrovascular accident): Presented with left-sided weakness-that has now resolved. MRI brain showed a right centrum semiovale infarct and a right parietal infarct. CT angiogram of the neck did not show any significant stenosis. Once thoracic echocardiogram showed preserved EF without any embolic source. A1C 5.7, LDL was 198. 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 Neurology-felt that this was likely asymptomatic, recommended continue aspirin on discharge. Subsequently underwent a loop recorder placement. Statins will be continued on discharge-please recheck lipid panel in 3 months. Please note- hypercoagulable panel is pending-this will need to be followed by PCP. Active Problems: Dyslipidemia: Continue statin neurology recommends to discontinue estrogen therapy on discharge.   Left message on member's contact number for home requesting return call to screen and discuss case management services. Will attempt third and final outreach at a later date.  Barrington Ellison RN,CCM,CDE [place] Management Coordinator Link To Wellness Office Phone [phone] Office Fax [phone]

## 2015-10-23 ENCOUNTER — Encounter: Payer: Self-pay | Admitting: *Deleted

## 2015-10-23 ENCOUNTER — Other Ambulatory Visit: Payer: Self-pay | Admitting: *Deleted

## 2015-10-23 NOTE — Patient Outreach (Signed)
Third and final phone outreach attempt for high cost referral assessment.  Summarized EPIC note after discharge from hospital on 1/25 after admission on 06/15/15. 06/15/15 acute CVA (cerebrovascular accident): Presented with left-sided weakness-that has now resolved. MRI brain showed a right centrum semiovale infarct and a right parietal infarct. CT angiogram of the neck did not show any significant stenosis. Once thoracic echocardiogram showed preserved EF without any embolic source. A1C 5.7, LDL was 198. 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 Neurology-felt that this was likely asymptomatic, recommended continue aspirin on discharge. Subsequently underwent a loop recorder placement. Statins will be continued on discharge-please recheck lipid panel in 3 months. Please note- hypercoagulable panel is pending-this will need to be followed by PCP. Active Problems: Dyslipidemia: Continue statin neurology recommends to discontinue estrogen therapy on discharge.   Left message on member's contact number that a letter will be mailed to her home today with information about [facility] CM services with contact number should she wish to discuss case management services. Will close case.  Barrington Ellison RN,CCM,CDE [place] Management Coordinator Link To Wellness Office Phone [phone] Office Fax [phone]

## 2015-11-03 MED FILL — GABAPENTIN 300 MG CAPSULE: 300 | 30 days supply | Qty: 90 | Fill #0

## 2015-11-05 MED FILL — ATORVASTATIN 40 MG TABLET: 40 | 90 days supply | Qty: 90 | Fill #2

## 2015-11-05 MED FILL — ASPIRIN EC 325 MG TABLET: 325 | 90 days supply | Qty: 90 | Fill #0

## 2015-11-05 MED FILL — VENLAFAXINE HCL ER 37.5 MG: 37.5 | 14 days supply | Qty: 14 | Fill #0

## 2015-11-16 ENCOUNTER — Ambulatory Visit (INDEPENDENT_AMBULATORY_CARE_PROVIDER_SITE_OTHER): Payer: 59 | Admitting: *Deleted

## 2015-11-16 DIAGNOSIS — I639 Cerebral infarction, unspecified: Secondary | ICD-10-CM

## 2015-11-16 NOTE — Progress Notes (Signed)
Carelink Summary Report / Loop Recorder 

## 2015-11-24 LAB — CUP PACEART REMOTE DEVICE CHECK: Date Time Interrogation Session: 20170525200640

## 2015-11-24 NOTE — Progress Notes (Signed)
Carelink summary report received. Battery status OK. Normal device function. No new symptom episodes, tachy episodes, brady, or pause episodes. No new AF episodes. Monthly summary reports and ROV/PRN 

## 2015-12-04 LAB — CUP PACEART REMOTE DEVICE CHECK: MDC IDC SESS DTM: 20170624203626

## 2015-12-07 MED FILL — GABAPENTIN 300 MG CAPSULE: 300 | 30 days supply | Qty: 90 | Fill #1

## 2015-12-14 ENCOUNTER — Ambulatory Visit (INDEPENDENT_AMBULATORY_CARE_PROVIDER_SITE_OTHER): Payer: 59 | Admitting: *Deleted

## 2015-12-14 DIAGNOSIS — I639 Cerebral infarction, unspecified: Secondary | ICD-10-CM | POA: Diagnosis not present

## 2015-12-15 NOTE — Progress Notes (Signed)
Carelink Summary Report / Loop Recorder 

## 2016-01-09 LAB — CUP PACEART REMOTE DEVICE CHECK: MDC IDC SESS DTM: 20170724211157

## 2016-01-09 NOTE — Progress Notes (Signed)
Carelink summary report received. Battery status OK. Normal device function. No new symptom episodes, tachy episodes, brady, or pause episodes. No new AF episodes. Monthly summary reports and ROV/PRN 

## 2016-01-13 ENCOUNTER — Ambulatory Visit (INDEPENDENT_AMBULATORY_CARE_PROVIDER_SITE_OTHER): Payer: 59 | Admitting: *Deleted

## 2016-01-13 DIAGNOSIS — I639 Cerebral infarction, unspecified: Secondary | ICD-10-CM

## 2016-01-13 MED FILL — GABAPENTIN 300 MG CAPSULE: 300 | 30 days supply | Qty: 90 | Fill #0

## 2016-01-14 NOTE — Progress Notes (Signed)
Carelink Summary Report / Loop Recorder 

## 2016-01-27 MED FILL — ATORVASTATIN 40 MG TABLET: 40 | 90 days supply | Qty: 90 | Fill #0

## 2016-01-27 MED FILL — ASPIRIN EC 325 MG TABLET: 325 | 90 days supply | Qty: 90 | Fill #1

## 2016-02-10 DIAGNOSIS — M25562 Pain in left knee: Secondary | ICD-10-CM | POA: Diagnosis not present

## 2016-02-10 LAB — CUP PACEART REMOTE DEVICE CHECK: MDC IDC SESS DTM: 20170823210753

## 2016-02-10 NOTE — Progress Notes (Signed)
Carelink summary report received. Battery status OK. Normal device function. No new symptom episodes, tachy episodes, brady, or pause episodes. No new AF episodes. Monthly summary reports and ROV/PRN 

## 2016-02-12 ENCOUNTER — Ambulatory Visit (INDEPENDENT_AMBULATORY_CARE_PROVIDER_SITE_OTHER): Payer: 59 | Admitting: *Deleted

## 2016-02-12 DIAGNOSIS — I639 Cerebral infarction, unspecified: Secondary | ICD-10-CM | POA: Diagnosis not present

## 2016-02-15 NOTE — Progress Notes (Signed)
Carelink Summary Report / Loop Recorder 

## 2016-02-16 MED FILL — GABAPENTIN 300 MG CAPSULE: 300 | 30 days supply | Qty: 90 | Fill #1

## 2016-03-02 DIAGNOSIS — Z8673 Personal history of transient ischemic attack (TIA), and cerebral infarction without residual deficits: Secondary | ICD-10-CM | POA: Diagnosis not present

## 2016-03-02 DIAGNOSIS — N951 Menopausal and female climacteric states: Secondary | ICD-10-CM | POA: Diagnosis not present

## 2016-03-02 DIAGNOSIS — E7800 Pure hypercholesterolemia, unspecified: Secondary | ICD-10-CM | POA: Diagnosis not present

## 2016-03-03 ENCOUNTER — Telehealth: Payer: Self-pay | Admitting: Nurse Practitioner

## 2016-03-03 ENCOUNTER — Ambulatory Visit: Payer: 59 | Admitting: Nurse Practitioner

## 2016-03-03 MED FILL — cloNIDine HCL 0.1 MG TABS: 0.1 | 60 days supply | Qty: 30 | Fill #0

## 2016-03-03 NOTE — Telephone Encounter (Signed)
Alexis/Dr Schoenhoss [phone] called to advise the provider is requesting a call prior to the pt's appt today at 2:45

## 2016-03-03 NOTE — Telephone Encounter (Signed)
I returned a phone call from Dr. Lessie Dings  from [facility] with regards to concern about starting this patient on low-dose clonidine for perimenopausal symptoms. I think it is okay and worth a shot if it helps the patient.

## 2016-03-04 ENCOUNTER — Encounter: Payer: Self-pay | Admitting: Nurse Practitioner

## 2016-03-09 ENCOUNTER — Other Ambulatory Visit (HOSPITAL_COMMUNITY): Payer: Self-pay | Admitting: Orthopaedic Surgery

## 2016-03-09 DIAGNOSIS — S83242A Other tear of medial meniscus, current injury, left knee, initial encounter: Secondary | ICD-10-CM

## 2016-03-09 DIAGNOSIS — M9420 Chondromalacia, unspecified site: Secondary | ICD-10-CM

## 2016-03-13 LAB — CUP PACEART REMOTE DEVICE CHECK: Date Time Interrogation Session: 20170922213554

## 2016-03-13 NOTE — Progress Notes (Signed)
Carelink summary report received. Battery status OK. Normal device function. No new symptom episodes, tachy episodes, brady, or pause episodes. No new AF episodes. Monthly summary reports and ROV with SK in 06/2016.

## 2016-03-14 ENCOUNTER — Ambulatory Visit (INDEPENDENT_AMBULATORY_CARE_PROVIDER_SITE_OTHER): Payer: 59 | Admitting: *Deleted

## 2016-03-14 ENCOUNTER — Other Ambulatory Visit: Payer: Self-pay | Admitting: Internal Medicine

## 2016-03-14 DIAGNOSIS — Z1231 Encounter for screening mammogram for malignant neoplasm of breast: Secondary | ICD-10-CM

## 2016-03-14 DIAGNOSIS — I639 Cerebral infarction, unspecified: Secondary | ICD-10-CM

## 2016-03-14 NOTE — Progress Notes (Signed)
Carelink Summary Report / Loop Recorder 

## 2016-03-15 ENCOUNTER — Ambulatory Visit (HOSPITAL_COMMUNITY)
Admission: RE | Admit: 2016-03-15 | Discharge: 2016-03-15 | Disposition: A | Discharge status: Home / Self Care | Payer: 59 | Source: Ambulatory Visit | Attending: Orthopaedic Surgery | Admitting: Orthopaedic Surgery

## 2016-03-15 DIAGNOSIS — M2242 Chondromalacia patellae, left knee: Secondary | ICD-10-CM | POA: Insufficient documentation

## 2016-03-15 DIAGNOSIS — S83242A Other tear of medial meniscus, current injury, left knee, initial encounter: Secondary | ICD-10-CM

## 2016-03-15 DIAGNOSIS — M25462 Effusion, left knee: Secondary | ICD-10-CM | POA: Diagnosis not present

## 2016-03-15 DIAGNOSIS — S83422A Sprain of lateral collateral ligament of left knee, initial encounter: Secondary | ICD-10-CM | POA: Diagnosis not present

## 2016-03-15 DIAGNOSIS — M659 Synovitis and tenosynovitis, unspecified: Secondary | ICD-10-CM | POA: Insufficient documentation

## 2016-03-15 DIAGNOSIS — M25562 Pain in left knee: Secondary | ICD-10-CM | POA: Diagnosis present

## 2016-03-15 DIAGNOSIS — M9420 Chondromalacia, unspecified site: Secondary | ICD-10-CM

## 2016-03-15 DIAGNOSIS — Z1231 Encounter for screening mammogram for malignant neoplasm of breast: Secondary | ICD-10-CM | POA: Diagnosis not present

## 2016-03-15 DIAGNOSIS — M71562 Other bursitis, not elsewhere classified, left knee: Secondary | ICD-10-CM | POA: Diagnosis not present

## 2016-03-21 DIAGNOSIS — N951 Menopausal and female climacteric states: Secondary | ICD-10-CM | POA: Diagnosis not present

## 2016-03-21 DIAGNOSIS — M25562 Pain in left knee: Secondary | ICD-10-CM | POA: Diagnosis not present

## 2016-03-21 DIAGNOSIS — Z01419 Encounter for gynecological examination (general) (routine) without abnormal findings: Secondary | ICD-10-CM | POA: Diagnosis not present

## 2016-03-22 DIAGNOSIS — Z0289 Encounter for other administrative examinations: Secondary | ICD-10-CM

## 2016-03-23 ENCOUNTER — Other Ambulatory Visit: Payer: Self-pay | Admitting: Orthopaedic Surgery

## 2016-03-23 ENCOUNTER — Encounter: Payer: Self-pay | Admitting: Nurse Practitioner

## 2016-03-23 ENCOUNTER — Ambulatory Visit (INDEPENDENT_AMBULATORY_CARE_PROVIDER_SITE_OTHER): Payer: 59 | Admitting: Nurse Practitioner

## 2016-03-23 VITALS — BP 118/74 | HR 60 | Ht 64.0 in | Wt 186.2 lb

## 2016-03-23 DIAGNOSIS — E785 Hyperlipidemia, unspecified: Secondary | ICD-10-CM | POA: Diagnosis not present

## 2016-03-23 DIAGNOSIS — G459 Transient cerebral ischemic attack, unspecified: Secondary | ICD-10-CM | POA: Diagnosis not present

## 2016-03-23 DIAGNOSIS — I639 Cerebral infarction, unspecified: Secondary | ICD-10-CM | POA: Diagnosis not present

## 2016-03-23 NOTE — Progress Notes (Signed)
I reviewed above note and agree with the assessment and plan.  Rosalin Hawking, MD PhD Stroke Neurology 03/23/2016 6:19 PM

## 2016-03-23 NOTE — Progress Notes (Signed)
[facility]  PATIENT: Hannah Brady DOB: 1960/06/30   REASON FOR VISIT: Follow-up for cryptogenic stroke HISTORY FROM: Patient    HISTORY OF PRESENT ILLNESS:UPDATE 11/1/17CM [name], 55 year old female returns for follow-up. 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 She is currently on aspirin 325 daily without further stroke or TIA symptoms. She has no bruising and no bleeding. Loop recorder has not shown atrial fibrillation. Blood pressure is well controlled in the office today at 118/74. She is tolerating Lipitor well without side effects of myalgias she is due to have a left knee arthroscopy on Friday of this week. She returns for reevaluation  HISTORY: 09/01/15 PS54 year lady seen today for first office follow-up visit for hospital admission for stroke on 06/15/2015. Hannah Brady is an 55 y.o. female with no past medical history. LSN 06/15/2015 at 0330 in AM. She went to get out of bed to turn her alarm off and noted she could not move her left side and had a right facial droop. Lasted for about 10 minutes and fully resolved. She cam to ED to get checked out. MRI confirmed right brain CVA. Patient was not administered TPA secondary to NIHSS=0, delay in arrival. She was admitted for further evaluation and treatment. CT angiogram of the brain showed mild atherosclerotic changes at carotid bifurcations but no significant large vessel stenosis. Transthoracic echo showed normal ejection fraction. 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 which was not felt to be a source of embolism. LDL cholesterol was elevated 198 for which she'll started on Lipitor. Hemoglobin A1c was 5.7. Patient was felt to look cryptogenic stroke of embolic etiology and underwent loop recorder insertion and so  far proximal as the atrial fibrillation has not yet been detected. She states is tolerating aspirin well without bleeding or bruising. Blood pressure is well controlled and it is 119/77 today. She is tolerating Lipitor well without side effects. She had follow-up lipid profile checked 2 weeks ago and it was satisfactory. She complains of intermittent tingling and numbness in her hands particularly at night. She does admit that she often sleeps with her hand beneath her head. She also works and has to do a lot of typing. Her primary physician feels this may be mild carpal tunnel and she plans to avoid activities with rapid repetitive wrist flexion and wear a wrist extension splint.   REVIEW OF SYSTEMS: Full 14 system review of systems performed and notable only for those listed, all others are neg:  Constitutional: neg  Cardiovascular: neg Ear/Nose/Throat: neg  Skin: neg Eyes: neg Respiratory: neg Gastroitestinal: neg  Hematology/Lymphatic: neg  Endocrine: neg Musculoskeletal: Knee pain Allergy/Immunology: neg Neurological: neg Psychiatric: neg Sleep : neg   ALLERGIES: No Known Allergies  HOME MEDICATIONS: Outpatient Medications Prior to Visit  Medication Sig Dispense Refill  . aspirin 325 MG tablet Take 1 tablet (325 mg total) by mouth daily. 60 tablet 0  . atorvastatin (LIPITOR) 40 MG tablet Take 1 tablet (40 mg total) by mouth daily at 6 PM. 30 tablet 0  . Multiple Vitamin (MULTIVITAMIN WITH MINERALS) TABS tablet Take 1 tablet by mouth daily.    [name] 7.5 MG CAPS   3   No facility-administered medications prior to visit.     PAST MEDICAL HISTORY: Past Medical History:  Diagnosis Date  . Complication  of anesthesia   . Hyperlipidemia   . PONV (postoperative nausea and vomiting)   . Stroke [facility])     PAST SURGICAL HISTORY: Past Surgical History:  Procedure Laterality Date  . CESAREAN SECTION    . EP IMPLANTABLE DEVICE N/A 06/17/2015   Procedure: Loop Recorder  Insertion;  Surgeon: Deboraha Sprang, MD;  Location: [place] [facility];  Service: Cardiovascular;  Laterality: N/A;  . TEE WITHOUT CARDIOVERSION N/A 06/17/2015   Procedure: TRANSESOPHAGEAL ECHOCARDIOGRAM (TEE);  Surgeon: Pixie Casino, MD;  Location: [facility] ENDOSCOPY;  Service: Cardiovascular;  Laterality: N/A;    FAMILY HISTORY: Family History  Problem Relation Age of Onset  . Diabetes Father     SOCIAL HISTORY: Social History   Social History  . Marital status: Married    Spouse name: N/A  . Number of children: N/A  . Years of education: N/A   Occupational History  . Not on file.   Social History Main Topics  . Smoking status: Never Smoker  . Smokeless tobacco: Never Used  . Alcohol use Yes     Comment: socially  . Drug use: No  . Sexual activity: Not on file   Other Topics Concern  . Not on file   Social History Narrative  . No narrative on file     PHYSICAL EXAM  Vitals:   03/23/16 1334  Weight: 186 lb 3.2 oz (84.5 kg)  Height: 5\' 4"  (1.626 m)   Body mass index is 31.96 kg/m. General: well developed, well nourished middle aged lady, seated, in no evident distress Head: head normocephalic and atraumatic.  Neck: supple with no carotid  bruits Cardiovascular: regular rate and rhythm, no murmurs Musculoskeletal: Left knee pain brace to area Skin:  no rash/petichiae Vascular:  Normal pulses all extremities  Neurological examination  Mental Status: Awake and fully alert. Oriented to place and time. Recent and remote memory intact. Attention span, concentration and fund of knowledge appropriate. Mood and affect appropriate.  Cranial Nerves: Fundoscopic exam not done. Pupils equal, briskly reactive to light. Extraocular movements full without nystagmus. Visual fields full to confrontation. Hearing intact. Facial sensation intact. Face, tongue, palate moves normally and symmetrically.  Motor: Normal bulk and tone. Normal strength in all tested extremity  muscles. Sensory.: intact to touch ,pinprick .position and vibratory sensation in the upper and lower extremities.  Coordination: Rapid alternating movements normal in all extremities. Finger-to-nose and heel-to-shin performed accurately bilaterally. Gait and Station: Arises from chair without difficulty. Stance is normal. Gait demonstrates normal stride length and balance . Able to heel, toe and tandem walk without difficulty.  Reflexes: 1+ and symmetric. Toes downgoing.      DIAGNOSTIC DATA (LABS, IMAGING, TESTING) - I reviewed patient records, labs, notes, testing and imaging myself where available.  Lab Results  Component Value Date   WBC 5.5 06/17/2015   HGB 15.1 (H) 06/17/2015   HCT 43.8 06/17/2015   MCV 88.1 06/17/2015   PLT 205 06/17/2015      Component Value Date/Time   NA 139 06/17/2015 0532   K 4.7 06/17/2015 0532   CL 105 06/17/2015 0532   CO2 25 06/17/2015 0532   GLUCOSE 101 (H) 06/17/2015 0532   BUN 17 06/17/2015 0532   CREATININE 0.83 06/17/2015 0532   CALCIUM 9.6 06/17/2015 0532   PROT 6.2 (L) 06/15/2015 0445   ALBUMIN 3.8 06/15/2015 0445   AST 19 06/15/2015 0445   ALT 23 06/15/2015 0445   ALKPHOS 43 06/15/2015 0445   BILITOT  0.7 06/15/2015 0445   GFRNONAA >60 06/17/2015 0532   GFRAA >60 06/17/2015 0532   Lab Results  Component Value Date   CHOL 293 (H) 06/16/2015   HDL 52 06/16/2015   LDLCALC 198 (H) 06/16/2015   TRIG 217 (H) 06/16/2015   CHOLHDL 5.6 06/16/2015   Lab Results  Component Value Date   HGBA1C 5.7 (H) 06/16/2015    ASSESSMENT AND PLAN 4 year lady with small) cortical and subcortical infarcts of cryptogenic etiology in January 2017 with vascular risk factors of hyperlipidemia only.Loop Recorder has not shown any atrial fibrillation. The patient is a current patient of Dr. Leonie Man who is out of the office today . This note is sent to the work in doctor.       PLAN: Continue aspirin 325 mg daily  for secondary stroke  prevention Maintain strict control of hypertension with blood pressure goal below 130/90, today's reading 118/74 lipids with LDL cholesterol goal below 70 mg/dL continue Lipitor I also advised the patient to eat a healthy diet with plenty of whole grains, cereals, fruits and vegetables, exercise regularly and maintain ideal body weight. Discussed with Dr. Erlinda Hong about left knee arthoscopy, no need to stop aspirin but surgeon may want to decrease to .81mg  daily until after procedure F/U in 6 months if stable will discharge Dennie Bible, [facility], [facility], [facility] [address] [place], [place] [address] [phone]

## 2016-03-23 NOTE — Patient Instructions (Addendum)
Continue aspirin 325 mg daily  for secondary stroke prevention Maintain strict control of hypertension with blood pressure goal below 130/90, today's reading 118/74 lipids with LDL cholesterol goal below 70 mg/dL continue Lipitor I also advised the patient to eat a healthy diet with plenty of whole grains, cereals, fruits and vegetables, exercise regularly and maintain ideal body weight. Discussed with Dr. Erlinda Hong about left knee arthoscopy, no need to stop aspirin but surgeon may want to decrease to .81mg  daily until after procedure F/U in 6 months if stable will discharge

## 2016-03-24 ENCOUNTER — Encounter (HOSPITAL_BASED_OUTPATIENT_CLINIC_OR_DEPARTMENT_OTHER): Payer: Self-pay | Admitting: *Deleted

## 2016-03-24 ENCOUNTER — Other Ambulatory Visit: Payer: Self-pay | Admitting: Obstetrics and Gynecology

## 2016-03-24 DIAGNOSIS — N951 Menopausal and female climacteric states: Secondary | ICD-10-CM

## 2016-03-24 NOTE — Progress Notes (Signed)
PMH reviewed with Dr. Linna Caprice MDA, patient is ok for outpatient surgery at [facility] 03/25/16.

## 2016-03-25 ENCOUNTER — Ambulatory Visit (HOSPITAL_BASED_OUTPATIENT_CLINIC_OR_DEPARTMENT_OTHER): Payer: 59 | Admitting: Anesthesiology

## 2016-03-25 ENCOUNTER — Encounter (HOSPITAL_BASED_OUTPATIENT_CLINIC_OR_DEPARTMENT_OTHER): Payer: Self-pay

## 2016-03-25 ENCOUNTER — Ambulatory Visit (HOSPITAL_BASED_OUTPATIENT_CLINIC_OR_DEPARTMENT_OTHER)
Admission: RE | Admit: 2016-03-25 | Discharge: 2016-03-25 | Disposition: A | Discharge status: Home / Self Care | Payer: 59 | Source: Ambulatory Visit | Attending: Orthopaedic Surgery | Admitting: Orthopaedic Surgery

## 2016-03-25 ENCOUNTER — Encounter (HOSPITAL_BASED_OUTPATIENT_CLINIC_OR_DEPARTMENT_OTHER)
Admission: RE | Disposition: A | Discharge status: Home / Self Care | Payer: Self-pay | Source: Ambulatory Visit | Attending: Orthopaedic Surgery

## 2016-03-25 DIAGNOSIS — Z8673 Personal history of transient ischemic attack (TIA), and cerebral infarction without residual deficits: Secondary | ICD-10-CM | POA: Diagnosis not present

## 2016-03-25 DIAGNOSIS — M23322 Other meniscus derangements, posterior horn of medial meniscus, left knee: Secondary | ICD-10-CM | POA: Diagnosis not present

## 2016-03-25 DIAGNOSIS — M1712 Unilateral primary osteoarthritis, left knee: Secondary | ICD-10-CM | POA: Insufficient documentation

## 2016-03-25 DIAGNOSIS — M23221 Derangement of posterior horn of medial meniscus due to old tear or injury, right knee: Secondary | ICD-10-CM | POA: Diagnosis not present

## 2016-03-25 DIAGNOSIS — Z833 Family history of diabetes mellitus: Secondary | ICD-10-CM | POA: Insufficient documentation

## 2016-03-25 DIAGNOSIS — M94262 Chondromalacia, left knee: Secondary | ICD-10-CM | POA: Diagnosis not present

## 2016-03-25 DIAGNOSIS — M2342 Loose body in knee, left knee: Secondary | ICD-10-CM | POA: Diagnosis not present

## 2016-03-25 DIAGNOSIS — E785 Hyperlipidemia, unspecified: Secondary | ICD-10-CM | POA: Diagnosis not present

## 2016-03-25 DIAGNOSIS — Z9889 Other specified postprocedural states: Secondary | ICD-10-CM | POA: Diagnosis not present

## 2016-03-25 DIAGNOSIS — R739 Hyperglycemia, unspecified: Secondary | ICD-10-CM | POA: Diagnosis not present

## 2016-03-25 HISTORY — PX: KNEE ARTHROSCOPY: SHX127

## 2016-03-25 HISTORY — PX: KNEE ARTHROSCOPY WITH MEDIAL MENISECTOMY: SHX5651

## 2016-03-25 HISTORY — PX: CHONDROPLASTY: SHX5177

## 2016-03-25 SURGERY — ARTHROSCOPY, KNEE
Anesthesia: General | Site: Knee | Laterality: Left

## 2016-03-25 MED ORDER — EPHEDRINE 5 MG/ML INJ
INTRAVENOUS | Status: AC
  Filled 2016-03-25: qty 10

## 2016-03-25 MED ORDER — HYDROCODONE-ACETAMINOPHEN 5-325 MG PO TABS
1.0000 | ORAL_TABLET | Freq: Once | ORAL | Status: AC
  Administered 2016-03-25: 1 via ORAL

## 2016-03-25 MED ORDER — ONDANSETRON HCL 4 MG/2ML IJ SOLN
INTRAMUSCULAR | Status: DC | PRN
  Administered 2016-03-25: 4 mg via INTRAVENOUS

## 2016-03-25 MED ORDER — SODIUM CHLORIDE 0.9 % IR SOLN
Status: DC | PRN
  Administered 2016-03-25: 6000 mL

## 2016-03-25 MED ORDER — LIDOCAINE 2% (20 MG/ML) 5 ML SYRINGE
INTRAMUSCULAR | Status: DC | PRN
  Administered 2016-03-25: 60 mg via INTRAVENOUS

## 2016-03-25 MED ORDER — HYDROCODONE-ACETAMINOPHEN 5-325 MG PO TABS
ORAL_TABLET | ORAL | Status: AC
  Filled 2016-03-25: qty 1

## 2016-03-25 MED ORDER — LACTATED RINGERS IV SOLN
INTRAVENOUS | Status: DC
  Administered 2016-03-25: 11:00:00 via INTRAVENOUS

## 2016-03-25 MED ORDER — CHLORHEXIDINE GLUCONATE 4 % EX LIQD: 60.0000 mL | Freq: Once | CUTANEOUS | Status: DC

## 2016-03-25 MED ORDER — MORPHINE SULFATE (PF) 4 MG/ML IV SOLN
INTRAVENOUS | Status: DC | PRN
  Administered 2016-03-25: 4 mg via SUBCUTANEOUS

## 2016-03-25 MED ORDER — LACTATED RINGERS IV SOLN: INTRAVENOUS | Status: DC

## 2016-03-25 MED ORDER — MORPHINE SULFATE (PF) 4 MG/ML IV SOLN
INTRAVENOUS | Status: AC
  Filled 2016-03-25: qty 1

## 2016-03-25 MED ORDER — MIDAZOLAM HCL 2 MG/2ML IJ SOLN
INTRAMUSCULAR | Status: AC
  Filled 2016-03-25: qty 2

## 2016-03-25 MED ORDER — PHENYLEPHRINE 40 MCG/ML (10ML) SYRINGE FOR IV PUSH (FOR BLOOD PRESSURE SUPPORT)
PREFILLED_SYRINGE | INTRAVENOUS | Status: AC
  Filled 2016-03-25: qty 10

## 2016-03-25 MED ORDER — DEXAMETHASONE SODIUM PHOSPHATE 4 MG/ML IJ SOLN
INTRAMUSCULAR | Status: DC | PRN
  Administered 2016-03-25: 10 mg via INTRAVENOUS

## 2016-03-25 MED ORDER — BUPIVACAINE HCL 0.5 % IJ SOLN
INTRAMUSCULAR | Status: DC | PRN
  Administered 2016-03-25: 20 mL

## 2016-03-25 MED ORDER — CEFAZOLIN SODIUM-DEXTROSE 2-4 GM/100ML-% IV SOLN
2.0000 g | INTRAVENOUS | Status: AC
  Administered 2016-03-25: 2 g via INTRAVENOUS

## 2016-03-25 MED ORDER — HYDROCODONE-ACETAMINOPHEN 5-325 MG PO TABS: 1.0000 | ORAL_TABLET | ORAL | 0 refills | Status: DC | PRN

## 2016-03-25 MED ORDER — MIDAZOLAM HCL 2 MG/2ML IJ SOLN
1.0000 mg | INTRAMUSCULAR | Status: DC | PRN
  Administered 2016-03-25: 2 mg via INTRAVENOUS

## 2016-03-25 MED ORDER — HYDROMORPHONE HCL 1 MG/ML IJ SOLN: 0.2500 mg | INTRAMUSCULAR | Status: DC | PRN

## 2016-03-25 MED ORDER — ARTIFICIAL TEARS OP OINT
TOPICAL_OINTMENT | OPHTHALMIC | Status: AC
  Filled 2016-03-25: qty 3.5

## 2016-03-25 MED ORDER — DEXAMETHASONE SODIUM PHOSPHATE 10 MG/ML IJ SOLN
INTRAMUSCULAR | Status: AC
  Filled 2016-03-25: qty 1

## 2016-03-25 MED ORDER — ONDANSETRON HCL 4 MG/2ML IJ SOLN
INTRAMUSCULAR | Status: AC
  Filled 2016-03-25: qty 2

## 2016-03-25 MED ORDER — PROPOFOL 10 MG/ML IV BOLUS
INTRAVENOUS | Status: DC | PRN
Start: 2016-03-25 — End: 2016-03-25
  Administered 2016-03-25: 150 mg via INTRAVENOUS

## 2016-03-25 MED ORDER — SCOPOLAMINE 1 MG/3DAYS TD PT72
MEDICATED_PATCH | TRANSDERMAL | Status: AC
  Filled 2016-03-25: qty 1

## 2016-03-25 MED ORDER — SCOPOLAMINE 1 MG/3DAYS TD PT72
1.0000 | MEDICATED_PATCH | Freq: Once | TRANSDERMAL | Status: DC | PRN
  Administered 2016-03-25: 1.5 mg via TRANSDERMAL

## 2016-03-25 MED ORDER — SUCCINYLCHOLINE CHLORIDE 200 MG/10ML IV SOSY
PREFILLED_SYRINGE | INTRAVENOUS | Status: AC
  Filled 2016-03-25: qty 10

## 2016-03-25 MED ORDER — PROMETHAZINE HCL 12.5 MG PO TABS: 12.5000 mg | ORAL_TABLET | Freq: Four times a day (QID) | ORAL | 0 refills | Status: DC | PRN

## 2016-03-25 MED ORDER — FENTANYL CITRATE (PF) 100 MCG/2ML IJ SOLN
INTRAMUSCULAR | Status: AC
  Filled 2016-03-25: qty 2

## 2016-03-25 MED ORDER — FENTANYL CITRATE (PF) 100 MCG/2ML IJ SOLN
50.0000 ug | INTRAMUSCULAR | Status: DC | PRN
  Administered 2016-03-25: 100 ug via INTRAVENOUS

## 2016-03-25 MED ORDER — LIDOCAINE 2% (20 MG/ML) 5 ML SYRINGE
INTRAMUSCULAR | Status: AC
  Filled 2016-03-25: qty 5

## 2016-03-25 MED ORDER — PROPOFOL 10 MG/ML IV BOLUS
INTRAVENOUS | Status: AC
  Filled 2016-03-25: qty 20

## 2016-03-25 MED FILL — HYDROCODON-APAP 5-325: 5-325 | 3 days supply | Qty: 30 | Fill #0

## 2016-03-25 MED FILL — PROMETHAZINE 12.5 MG TABLET: 12.5 | 4 days supply | Qty: 30 | Fill #0

## 2016-03-25 SURGICAL SUPPLY — 36 items
BANDAGE ACE 6X5 VEL STRL LF (GAUZE/BANDAGES/DRESSINGS) ×2 IMPLANT
BLADE CUDA 5.5 (BLADE) IMPLANT
BLADE CUTTER GATOR 3.5 (BLADE) ×2 IMPLANT
BLADE GREAT WHITE 4.2 (BLADE) IMPLANT
BNDG GAUZE ELAST 4 BULKY (GAUZE/BANDAGES/DRESSINGS) ×2 IMPLANT
DRAPE ARTHROSCOPY W/POUCH 114 (DRAPES) ×2 IMPLANT
DRAPE U-SHAPE 47X51 STRL (DRAPES) ×2 IMPLANT
DRSG EMULSION OIL 3X3 NADH (GAUZE/BANDAGES/DRESSINGS) ×2 IMPLANT
DURAPREP 26ML APPLICATOR (WOUND CARE) ×3 IMPLANT
ELECT MENISCUS 165MM 90D (ELECTRODE) IMPLANT
ELECT REM PT RETURN 9FT ADLT (ELECTROSURGICAL)
ELECTRODE REM PT RTRN 9FT ADLT (ELECTROSURGICAL) IMPLANT
GAUZE SPONGE 4X4 12PLY STRL (GAUZE/BANDAGES/DRESSINGS) ×2 IMPLANT
GLOVE BIO SURGEON STRL SZ8 (GLOVE) ×4 IMPLANT
GLOVE BIOGEL PI IND STRL 7.5 (GLOVE) IMPLANT
GLOVE BIOGEL PI IND STRL 8 (GLOVE) ×2 IMPLANT
GLOVE BIOGEL PI INDICATOR 7.5 (GLOVE) ×1
GLOVE BIOGEL PI INDICATOR 8 (GLOVE) ×2
GLOVE SURG SS PI 7.0 STRL IVOR (GLOVE) ×1 IMPLANT
GOWN STRL REUS W/ TWL LRG LVL3 (GOWN DISPOSABLE) ×1 IMPLANT
GOWN STRL REUS W/ TWL XL LVL3 (GOWN DISPOSABLE) ×2 IMPLANT
GOWN STRL REUS W/TWL LRG LVL3 (GOWN DISPOSABLE) ×2
GOWN STRL REUS W/TWL XL LVL3 (GOWN DISPOSABLE) ×4
IV NS IRRIG 3000ML ARTHROMATIC (IV SOLUTION) ×2 IMPLANT
KNEE WRAP E Z 3 GEL PACK (MISCELLANEOUS) ×2 IMPLANT
MANIFOLD NEPTUNE II (INSTRUMENTS) ×1 IMPLANT
PACK ARTHROSCOPY DSU (CUSTOM PROCEDURE TRAY) ×2 IMPLANT
PACK BASIN DAY SURGERY FS (CUSTOM PROCEDURE TRAY) ×2 IMPLANT
PENCIL BUTTON HOLSTER BLD 10FT (ELECTRODE) IMPLANT
SET ARTHROSCOPY TUBING (MISCELLANEOUS) ×2
SET ARTHROSCOPY TUBING LN (MISCELLANEOUS) ×1 IMPLANT
SHEET MEDIUM DRAPE 40X70 STRL (DRAPES) ×2 IMPLANT
SYR 3ML 18GX1 1/2 (SYRINGE) IMPLANT
TOWEL OR 17X24 6PK STRL BLUE (TOWEL DISPOSABLE) ×2 IMPLANT
TOWEL OR NON WOVEN STRL DISP B (DISPOSABLE) ×2 IMPLANT
WATER STERILE IRR 1000ML POUR (IV SOLUTION) ×2 IMPLANT

## 2016-03-25 NOTE — Discharge Instructions (Signed)

## 2016-03-25 NOTE — Op Note (Signed)
#  [id number] 

## 2016-03-25 NOTE — H&P (Signed)
Hannah Brady is an 55 y.o. female.   Chief Complaint: Left knee pain HPI: Hannah Brady is in again about her left knee which remains swollen and painful. 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 She has trouble resting at night. Her pain is intermittent and severe.   MRI:  I reviewed an MRI scan films and report of a study done at the [facility] system on 03/11/16. This shows a moderate effusion with some intrameniscal degenerative changes and an obvious loose body. She does have some degenerative changes especially patellofemoral.  Past Medical History:  Diagnosis Date  . Complication of anesthesia   . Hyperlipidemia   . PONV (postoperative nausea and vomiting)   . Stroke [facility])    no defecits    Past Surgical History:  Procedure Laterality Date  . CESAREAN SECTION    . EP IMPLANTABLE DEVICE N/A 06/17/2015   Procedure: Loop Recorder Insertion;  Surgeon: Deboraha Sprang, MD;  Location: [place] [facility] LAB;  Service: Cardiovascular;  Laterality: N/A;  . TEE WITHOUT CARDIOVERSION N/A 06/17/2015   Procedure: TRANSESOPHAGEAL ECHOCARDIOGRAM (TEE);  Surgeon: Pixie Casino, MD;  Location: [facility] ENDOSCOPY;  Service: Cardiovascular;  Laterality: N/A;    Family History  Problem Relation Age of Onset  . Diabetes Father    Social History:  reports that she has never smoked. She has never used smokeless tobacco. She reports that she drinks alcohol. She reports that she does not use drugs.  Allergies: No Known Allergies  No prescriptions prior to admission.    No results found for this or any previous visit (from the past 48 hour(s)). No results found.  Review of Systems  Musculoskeletal: Positive for joint pain.       Left knee  All other systems reviewed and are negative.   Height 5\' 4"  (1.626 m), weight 84.4 kg (186 lb). Physical Exam  Constitutional: She is oriented to person, place, and time. She appears well-developed and well-nourished.  HENT:  Head:  Normocephalic and atraumatic.  Eyes: Pupils are equal, round, and reactive to light.  Neck: Normal range of motion.  Cardiovascular: Normal rate and regular rhythm.   Respiratory: Effort normal.  GI: Soft.  Musculoskeletal:  Left knee has 1+ effusion. She has motion from 0-125. She has some mild crepitation. She has some medial joint line pain.  Hip motion is full and pain free and SLR is negative on both sides.  There is no palpable LAD behind either knee.  Sensation and motor function are intact on both sides and there are palpable pulses on both sides.   Neurological: She is alert and oriented to person, place, and time.  Skin: Skin is warm and dry.  Psychiatric: She has a normal mood and affect. Her behavior is normal. Judgment and thought content normal.     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 I think we can help her with a knee arthroscopy.It would be nice if we could take her off her aspirin maybe 5 days prior to the procedure but if that isn't possible we could potentially do it just leaving her on the aspirin. I reviewed risk of anesthesia and infection related to the procedure.   Bracken Moffa, Larwance Sachs, PA-C 03/25/2016, 8:06 AM

## 2016-03-25 NOTE — Interval H&P Note (Signed)
History and Physical Interval Note:  03/25/2016 12:44 PM  Hannah Brady  has presented today for surgery, with the diagnosis of LEFT KNEE CHONDROMALACIA AND LOOSE BODY  The various methods of treatment have been discussed with the patient and family. After consideration of risks, benefits and other options for treatment, the patient has consented to  Procedure(s): ARTHROSCOPY KNEE (Left) as a surgical intervention .  The patient's history has been reviewed, patient examined, no change in status, stable for surgery.  I have reviewed the patient's chart and labs.  Questions were answered to the patient's satisfaction.     Leonce Bale G

## 2016-03-25 NOTE — Transfer of Care (Signed)
Immediate Anesthesia Transfer of Care Note  Patient: Hannah Brady  Procedure(s) Performed: Procedure(s): ARTHROSCOPY KNEE (Left) CHONDROPLASTY (Left) KNEE ARTHROSCOPY WITH MEDIAL MENISECTOMY (Left)  Patient Location: PACU  Anesthesia Type:General  Level of Consciousness: sedated and patient cooperative  Airway & Oxygen Therapy: Patient Spontanous Breathing and Patient connected to face mask oxygen  Post-op Assessment: Report given to RN and Post -op Vital signs reviewed and stable  Post vital signs: Reviewed and stable  Last Vitals:  Vitals:   03/25/16 1050  BP: (!) 142/64  Pulse: 70  Temp: 37 C    Last Pain:  Vitals:   03/25/16 1050  TempSrc: Oral  PainSc: 4       Patients Stated Pain Goal: 4 ([id number])  Complications: No apparent anesthesia complications

## 2016-03-25 NOTE — Anesthesia Preprocedure Evaluation (Addendum)
Anesthesia Evaluation  Patient identified by MRN, date of birth, ID band Patient awake    Reviewed: Allergy & Precautions, H&P , NPO status , Patient's Chart, lab work & pertinent test results  History of Anesthesia Complications (+) PONV  Airway Mallampati: II  TM Distance: >3 FB Neck ROM: Full    Dental no notable dental hx. (+) Teeth Intact, Dental Advisory Given   Pulmonary neg pulmonary ROS,    Pulmonary exam normal breath sounds clear to auscultation       Cardiovascular negative cardio ROS   Rhythm:Regular Rate:Normal     Neuro/Psych  Headaches, CVA, No Residual Symptoms negative psych ROS   GI/Hepatic negative GI ROS, Neg liver ROS,   Endo/Other  negative endocrine ROS  Renal/GU negative Renal ROS  negative genitourinary   Musculoskeletal   Abdominal   Peds  Hematology negative hematology ROS (+)   Anesthesia Other Findings   Reproductive/Obstetrics negative OB ROS                            Anesthesia Physical Anesthesia Plan  ASA: II  Anesthesia Plan: General   Post-op Pain Management:    Induction: Intravenous  Airway Management Planned: LMA  Additional Equipment:   Intra-op Plan:   Post-operative Plan: Extubation in OR  Informed Consent: I have reviewed the patients History and Physical, chart, labs and discussed the procedure including the risks, benefits and alternatives for the proposed anesthesia with the patient or authorized representative who has indicated his/her understanding and acceptance.   Dental advisory given  Plan Discussed with: CRNA  Anesthesia Plan Comments:         Anesthesia Quick Evaluation

## 2016-03-25 NOTE — Anesthesia Postprocedure Evaluation (Signed)
Anesthesia Post Note  Patient: SUKHPREET FIECHTNER  Procedure(s) Performed: Procedure(s) (LRB): ARTHROSCOPY KNEE (Left) CHONDROPLASTY (Left) KNEE ARTHROSCOPY WITH MEDIAL MENISECTOMY (Left)  Patient location during evaluation: PACU Anesthesia Type: General Level of consciousness: awake and alert Pain management: pain level controlled Vital Signs Assessment: post-procedure vital signs reviewed and stable Respiratory status: spontaneous breathing, nonlabored ventilation and respiratory function stable Cardiovascular status: blood pressure returned to baseline and stable Postop Assessment: no signs of nausea or vomiting Anesthetic complications: no    Last Vitals:  Vitals:   03/25/16 1400 03/25/16 1415  BP: 127/67 126/79  Pulse: 81 80  Resp: 14 18  Temp:      Last Pain:  Vitals:   03/25/16 1415  TempSrc:   PainSc: 0-No pain    LLE Motor Response: Purposeful movement (03/25/16 1415) LLE Sensation: Full sensation (03/25/16 1415)          Eleana Tocco,W. EDMOND

## 2016-03-25 NOTE — Anesthesia Procedure Notes (Signed)
Procedure Name: LMA Insertion Date/Time: 03/25/2016 12:57 PM Performed by: Lyndee Leo Pre-anesthesia Checklist: Patient identified, Emergency Drugs available, Suction available and Patient being monitored Patient Re-evaluated:Patient Re-evaluated prior to inductionOxygen Delivery Method: Circle system utilized Preoxygenation: Pre-oxygenation with 100% oxygen Intubation Type: IV induction Ventilation: Mask ventilation without difficulty LMA: LMA inserted LMA Size: 4.0 Number of attempts: 1 Airway Equipment and Method: Bite block Placement Confirmation: positive ETCO2 Tube secured with: Tape Dental Injury: Teeth and Oropharynx as per pre-operative assessment

## 2016-03-28 ENCOUNTER — Ambulatory Visit: Payer: 59

## 2016-03-28 NOTE — Op Note (Signed)
Hannah Brady, Hannah Brady            ACCOUNT NO.:  [id number]  MEDICAL RECORD NO.:  [id number]  LOCATION:                                 FACILITY:  PHYSICIAN:  Monico Blitz. Ossie Beltran, M.D.DATE OF BIRTH:  [birth year]  DATE OF PROCEDURE:  03/25/2016 DATE OF DISCHARGE:                              OPERATIVE REPORT   PREOPERATIVE DIAGNOSES: 1. Left knee loose body. 2. Left knee chondromalacia.  POSTOPERATIVE DIAGNOSES: 1. Left knee torn medial meniscus. 2. Left knee chondromalacia.  PROCEDURES: 1. Left knee partial medial meniscectomy. 2. Left knee abrasion chondroplasty, medial and lateral. 3. Left knee removal of loose bodies.  ANESTHESIA:  General.  ATTENDING SURGEON:  Monico Blitz. [name], M.D.  ASSISTANT:  Loni Dolly, PA.  INDICATION FOR PROCEDURE:  The patient is a 55 year old woman with several months of left knee pain and swelling.  This has persisted despite aspiration, therapeutic exercise, bracing, and some pills.  By MRI scan, she has a loose body with some degenerative change of the medial meniscus and some early arthritic change.  She has pain and swelling which limits her ability to remain active and rest.  She is offered an arthroscopy.  Informed operative consent was obtained after discussion of possible complications including reaction to anesthesia and infection.  SUMMARY OF FINDINGS AND PROCEDURE:  Under general anesthesia, an arthroscopy of the left knee was performed.  Suprapatellar pouch was benign except for some small loose bodies which were removed.  She did have some grade 3 change patellofemoral and a chondroplasty was done. The patella tracked in a relatively normal position.  In the medial compartment, she had a degenerative tear of the posterior horn of the medial meniscus addressed with about a 5% of partial medial meniscectomy.  She had focal grade 4 change on the medial femoral condyle and a thorough chondroplasty was done here.  She had a  similar finding on the lateral femoral condyle with a focal defect and chondroplasty was done as well there.  We did encounter some bleeding bone on both sides.  The lateral meniscus itself was intact and the ACL was normal.  She was injected with the usual agents plus some Depo- Medrol and discharged home same day.  DESCRIPTION OF PROCEDURE:  The patient was taken to an operating suite where general anesthetic was applied without difficulty.  She was positioned supine and prepped and draped in normal sterile fashion. After the administration of preop IV Kefzol and an appropriate time-out, an arthroscopy of the left knee was performed through a total of 2 portals.  Findings were as noted above and procedure consisted of a partial medial meniscectomy which was done with a basket and shaver followed by the aforementioned abrasion chondroplasties both medial and lateral.  I also performed a standard chondroplasty patellofemoral and removed some small cartilaginous loose bodies.  The knee was thoroughly irrigated at the end of the case.  We injected with Marcaine and epinephrine with some Depo-Medrol.  Adaptic was placed over the portals, followed by dry gauze and a loose Ace wrap.  Estimated blood loss and intraoperative fluids can be obtained from anesthesia records.  DISPOSITION:  The patient was extubated in the operating room and  taken to recovery room in stable condition.  Plans were for her to go home same-day and to follow up in the office in less than a week.  I will contact her by phone tonight.     Monico Blitz [name], M.D.   ______________________________ Monico Blitz. [name], M.D.    PGD/MEDQ  D:  03/25/2016  T:  03/26/2016  Job:  [id number]

## 2016-03-29 ENCOUNTER — Telehealth: Payer: Self-pay

## 2016-03-29 NOTE — Telephone Encounter (Signed)
Rn call [employer] and spoke with Sandi Raveling at [phone] surgical coordinator. Rn stated a request was done for surgical clearance on her left knee. Juliann Pulse stated the instructions were put on Carolyn(NP) note from last office visit. Pt already had the procedure done.

## 2016-03-31 ENCOUNTER — Encounter (HOSPITAL_BASED_OUTPATIENT_CLINIC_OR_DEPARTMENT_OTHER): Payer: Self-pay | Admitting: Orthopaedic Surgery

## 2016-04-01 DIAGNOSIS — Z9889 Other specified postprocedural states: Secondary | ICD-10-CM | POA: Diagnosis not present

## 2016-04-01 MED FILL — CEPHALEXIN 500 MG CAPSULE: 500 | 7 days supply | Qty: 28 | Fill #0

## 2016-04-09 LAB — CUP PACEART REMOTE DEVICE CHECK
Date Time Interrogation Session: 20171022213946
MDC IDC PG IMPLANT DT: 20170125

## 2016-04-09 NOTE — Progress Notes (Signed)
Carelink summary report received. Battery status OK. Normal device function. No new symptom episodes, tachy episodes, brady, or pause episodes. No new AF episodes. Monthly summary reports and ROV/PRN 

## 2016-04-12 ENCOUNTER — Ambulatory Visit (INDEPENDENT_AMBULATORY_CARE_PROVIDER_SITE_OTHER): Payer: 59 | Admitting: *Deleted

## 2016-04-12 DIAGNOSIS — I639 Cerebral infarction, unspecified: Secondary | ICD-10-CM

## 2016-04-12 DIAGNOSIS — N951 Menopausal and female climacteric states: Secondary | ICD-10-CM | POA: Diagnosis not present

## 2016-04-12 DIAGNOSIS — Z780 Asymptomatic menopausal state: Secondary | ICD-10-CM | POA: Diagnosis not present

## 2016-04-12 MED FILL — ESCITALOPRAM 5 MG TABLET: 5 | 30 days supply | Qty: 60 | Fill #0

## 2016-04-13 NOTE — Progress Notes (Signed)
Carelink Summary Report / Loop Recorder 

## 2016-04-18 ENCOUNTER — Ambulatory Visit
Admission: RE | Admit: 2016-04-18 | Discharge: 2016-04-18 | Disposition: A | Discharge status: Home / Self Care | Payer: 59 | Source: Ambulatory Visit | Attending: Internal Medicine | Admitting: Internal Medicine

## 2016-04-18 DIAGNOSIS — Z1231 Encounter for screening mammogram for malignant neoplasm of breast: Secondary | ICD-10-CM | POA: Diagnosis not present

## 2016-04-25 DIAGNOSIS — Z9889 Other specified postprocedural states: Secondary | ICD-10-CM | POA: Diagnosis not present

## 2016-04-28 ENCOUNTER — Ambulatory Visit
Admission: RE | Admit: 2016-04-28 | Discharge: 2016-04-28 | Disposition: A | Discharge status: Home / Self Care | Payer: 59 | Source: Ambulatory Visit | Attending: Obstetrics and Gynecology | Admitting: Obstetrics and Gynecology

## 2016-04-28 DIAGNOSIS — Z13820 Encounter for screening for osteoporosis: Secondary | ICD-10-CM | POA: Diagnosis not present

## 2016-04-28 DIAGNOSIS — Z780 Asymptomatic menopausal state: Secondary | ICD-10-CM | POA: Diagnosis not present

## 2016-04-28 DIAGNOSIS — N951 Menopausal and female climacteric states: Secondary | ICD-10-CM

## 2016-04-29 ENCOUNTER — Other Ambulatory Visit: Payer: 59

## 2016-05-02 DIAGNOSIS — M23322 Other meniscus derangements, posterior horn of medial meniscus, left knee: Secondary | ICD-10-CM | POA: Diagnosis not present

## 2016-05-02 DIAGNOSIS — M25562 Pain in left knee: Secondary | ICD-10-CM | POA: Diagnosis not present

## 2016-05-06 DIAGNOSIS — Z9889 Other specified postprocedural states: Secondary | ICD-10-CM | POA: Diagnosis not present

## 2016-05-06 MED FILL — predniSONE 5 MG TABS: 5 | 7 days supply | Qty: 21 | Fill #0

## 2016-05-11 MED FILL — ATORVASTATIN 40 MG TABLET: 40 | 90 days supply | Qty: 90 | Fill #1

## 2016-05-11 MED FILL — ASPIRIN EC 325 MG TABLET: 325 | 90 days supply | Qty: 90 | Fill #2

## 2016-05-11 MED FILL — ESCITALOPRAM 5 MG TABLET: 5 | 30 days supply | Qty: 60 | Fill #1

## 2016-05-12 ENCOUNTER — Ambulatory Visit (INDEPENDENT_AMBULATORY_CARE_PROVIDER_SITE_OTHER): Payer: 59 | Admitting: *Deleted

## 2016-05-12 DIAGNOSIS — I639 Cerebral infarction, unspecified: Secondary | ICD-10-CM

## 2016-05-13 DIAGNOSIS — Z9889 Other specified postprocedural states: Secondary | ICD-10-CM | POA: Diagnosis not present

## 2016-05-13 NOTE — Progress Notes (Signed)
Carelink Summary Report / Loop Recorder 

## 2016-05-26 LAB — CUP PACEART REMOTE DEVICE CHECK
MDC IDC PG IMPLANT DT: 20170125
MDC IDC SESS DTM: 20171121223633

## 2016-05-26 NOTE — Progress Notes (Signed)
Carelink summary report received. Battery status OK. Normal device function. No new symptom episodes, tachy episodes, brady, or pause episodes. No new AF episodes. Monthly summary reports and ROV/PRN 

## 2016-06-10 MED FILL — ESCITALOPRAM 5 MG TABLET: 5 | 30 days supply | Qty: 60 | Fill #2

## 2016-06-13 ENCOUNTER — Ambulatory Visit (INDEPENDENT_AMBULATORY_CARE_PROVIDER_SITE_OTHER): Payer: 59 | Admitting: *Deleted

## 2016-06-13 DIAGNOSIS — I639 Cerebral infarction, unspecified: Secondary | ICD-10-CM | POA: Diagnosis not present

## 2016-06-13 DIAGNOSIS — Z9889 Other specified postprocedural states: Secondary | ICD-10-CM | POA: Diagnosis not present

## 2016-06-13 MED FILL — DICLOFENAC SODIUM 1% GEL: 1 | 10 days supply | Qty: 200 | Fill #0

## 2016-06-15 NOTE — Progress Notes (Signed)
Carelink Summary Report / Loop Recorder 

## 2016-06-22 DIAGNOSIS — N951 Menopausal and female climacteric states: Secondary | ICD-10-CM | POA: Diagnosis not present

## 2016-06-22 MED FILL — ESCITALOPRAM 10 MG TABLET: 10 | 90 days supply | Qty: 180 | Fill #0

## 2016-06-24 DIAGNOSIS — H52222 Regular astigmatism, left eye: Secondary | ICD-10-CM | POA: Diagnosis not present

## 2016-06-24 DIAGNOSIS — H5213 Myopia, bilateral: Secondary | ICD-10-CM | POA: Diagnosis not present

## 2016-06-24 DIAGNOSIS — H479 Unspecified disorder of visual pathways: Secondary | ICD-10-CM | POA: Diagnosis not present

## 2016-06-24 DIAGNOSIS — H43813 Vitreous degeneration, bilateral: Secondary | ICD-10-CM | POA: Diagnosis not present

## 2016-06-28 LAB — CUP PACEART REMOTE DEVICE CHECK
Implantable Pulse Generator Implant Date: 20170125
MDC IDC SESS DTM: 20171221231058

## 2016-07-04 ENCOUNTER — Ambulatory Visit (INDEPENDENT_AMBULATORY_CARE_PROVIDER_SITE_OTHER): Payer: 59 | Admitting: Physician Assistant

## 2016-07-04 VITALS — BP 122/84 | HR 78 | Ht 64.0 in | Wt 190.0 lb

## 2016-07-04 DIAGNOSIS — Z4509 Encounter for adjustment and management of other cardiac device: Secondary | ICD-10-CM

## 2016-07-04 NOTE — Patient Instructions (Addendum)
Medication Instructions:   Your physician recommends that you continue on your current medications as directed. Please refer to the Current Medication list given to you today.   If you need a refill on your cardiac medications before your next appointment, please call your pharmacy.  Labwork: NONE ORDERED  TODAY    Testing/Procedures: NONE ORDERED  TODAY    Follow-Up: Your physician wants you to follow-up in: ONE YEAR WITH  KLEIN   You will receive a reminder letter in the mail two months in advance. If you don't receive a letter, please call our office to schedule the follow-up appointment.     Any Other Special Instructions Will Be Listed Below (If Applicable).                                                                                                                                                    

## 2016-07-04 NOTE — Progress Notes (Signed)
Cardiology Office Note Date:  07/04/2016  Patient ID:  Hannah, Brady Sep 15, 1960, MRN [id number] PCP:  Kelton Pillar, MD  Cardiologist:  Dr. Caryl Comes (ILR implant)   Chief Complaint: in-clinic ILR visit  History of Present Illness: Hannah Brady is a 56 y.o. female with history of CVA only (Jan 2017) comes in today to be seen for Dr. Caryl Comes, last seen by him in-patient at the time of her ILR implant Jan 2017.  She comes today feeling very well, denies any kind of CP, palpitations or SOB, no dizziness, near syncope or syncope.  Her lipids/labs are monitored and managed by her PMD.  She reports they suspect her Estrogen replacement +/- HLD as etiology of her stroke.   Device information: MDT ILR implanted  06/17/15, cryptogenic stroke No findings of AF (or any other observations) as of yet   Past Medical History:  Diagnosis Date  . Complication of anesthesia   . Hyperlipidemia   . PONV (postoperative nausea and vomiting)   . Stroke [facility])    no defecits    Past Surgical History:  Procedure Laterality Date  . CESAREAN SECTION    . CHONDROPLASTY Left 03/25/2016   Procedure: CHONDROPLASTY;  Surgeon: Melrose Nakayama, MD;  Location: [place];  Service: Orthopedics;  Laterality: Left;  . EP IMPLANTABLE DEVICE N/A 06/17/2015   Procedure: Loop Recorder Insertion;  Surgeon: Deboraha Sprang, MD;  Location: [place] CV LAB;  Service: Cardiovascular;  Laterality: N/A;  . KNEE ARTHROSCOPY Left 03/25/2016   Procedure: ARTHROSCOPY KNEE;  Surgeon: Melrose Nakayama, MD;  Location: [place];  Service: Orthopedics;  Laterality: Left;  . KNEE ARTHROSCOPY WITH MEDIAL MENISECTOMY Left 03/25/2016   Procedure: KNEE ARTHROSCOPY WITH MEDIAL MENISECTOMY;  Surgeon: Melrose Nakayama, MD;  Location: [place];  Service: Orthopedics;  Laterality: Left;  . TEE WITHOUT CARDIOVERSION N/A 06/17/2015   Procedure: TRANSESOPHAGEAL ECHOCARDIOGRAM (TEE);   Surgeon: Pixie Casino, MD;  Location: [facility] ENDOSCOPY;  Service: Cardiovascular;  Laterality: N/A;    Current Outpatient Prescriptions  Medication Sig Dispense Refill  . aspirin 325 MG tablet Take 1 tablet (325 mg total) by mouth daily. 60 tablet 0  . atorvastatin (LIPITOR) 40 MG tablet Take 1 tablet (40 mg total) by mouth daily at 6 PM. 30 tablet 0  . escitalopram (LEXAPRO) 5 MG tablet Take 20 mg by mouth daily.  2  . Multiple Vitamin (MULTIVITAMIN WITH MINERALS) TABS tablet Take 1 tablet by mouth daily.     No current facility-administered medications for this visit.     Allergies:   Patient has no known allergies.   Social History:  The patient  reports that she has never smoked. She has never used smokeless tobacco. She reports that she drinks alcohol. She reports that she does not use drugs.   Family History:  The patient's family history includes Diabetes in her father.  ROS:  Please see the history of present illness.  All other systems are reviewed and otherwise negative.   PHYSICAL EXAM:  VS:  BP 122/84   Pulse 78   Ht 5\' 4"  (1.626 m)   Wt 190 lb (86.2 kg)   BMI 32.61 kg/m  BMI: Body mass index is 32.61 kg/m. Well nourished, well developed, in no acute distress  HEENT: normocephalic, atraumatic  Neck: no JVD, carotid bruits or masses Cardiac:  RRR; no significant murmurs, no rubs, or gallops Lungs:  CTA b/l, no wheezing, rhonchi or rales  Abd:  soft, nontender MS: no deformity or atrophy Ext: no edema  Skin: warm and dry, no rash Neuro:  No gross deficits appreciated Psych: euthymic mood, full affect  ILR site is stable, no tethering or discomfort  ILR interrogation done today and reviewed by myself: she is in SR today, R waves are good, one AF episode is artifactual double counting  06/15/15: TTE Study Conclusions - Left ventricle: The cavity size was normal. There was mild   concentric hypertrophy. Systolic function was normal. The   estimated ejection  fraction was in the range of 55% to 60%. Wall   motion was normal; there were no regional wall motion   abnormalities. Left ventricular diastolic function parameters   were normal. - Aortic valve: There was mild regurgitation. - Mitral valve: There was mild regurgitation. - Right ventricle: The cavity size was normal. Wall thickness was   normal. Systolic function was normal. - Tricuspid valve: There was no regurgitation.  Recent Labs: No results found for requested labs within last 8760 hours.  No results found for requested labs within last 8760 hours.   CrCl cannot be calculated (Patient's most recent lab result is older than the maximum 21 days allowed.).   Wt Readings from Last 3 Encounters:  07/04/16 190 lb (86.2 kg)  03/25/16 183 lb 2 oz (83.1 kg)  03/23/16 186 lb 3.2 oz (84.5 kg)     Other studies reviewed: Additional studies/records reviewed today include: summarized above  ASSESSMENT AND PLAN:  1. Cryptogenic stroke w/ILR     Site is stable     No observations as of yet     Continue monthly remotes  Disposition: F/u with Dr. Caryl Comes in 1 year, sooner if needed.  Current medicines are reviewed at length with the patient today.  The patient did not have any concerns regarding medicines.  Haywood Lasso, PA-C 07/04/2016 3:01 PM     [address] [place] [address] [phone] (office)  [phone] (fax)

## 2016-07-11 ENCOUNTER — Ambulatory Visit (INDEPENDENT_AMBULATORY_CARE_PROVIDER_SITE_OTHER): Payer: 59 | Admitting: *Deleted

## 2016-07-11 DIAGNOSIS — I639 Cerebral infarction, unspecified: Secondary | ICD-10-CM

## 2016-07-12 NOTE — Progress Notes (Signed)
Carelink Summary Report / Loop Recorder 

## 2016-07-13 DIAGNOSIS — M25562 Pain in left knee: Secondary | ICD-10-CM | POA: Diagnosis not present

## 2016-07-16 LAB — CUP PACEART REMOTE DEVICE CHECK
Date Time Interrogation Session: 20180120233656
MDC IDC PG IMPLANT DT: 20170125

## 2016-07-27 DIAGNOSIS — M25562 Pain in left knee: Secondary | ICD-10-CM | POA: Diagnosis not present

## 2016-08-02 LAB — CUP PACEART REMOTE DEVICE CHECK
Implantable Pulse Generator Implant Date: 20170125
MDC IDC SESS DTM: 20180219233904

## 2016-08-04 MED FILL — ASPIRIN EC 325 MG TABLET: 325 | 90 days supply | Qty: 90 | Fill #3

## 2016-08-04 MED FILL — ATORVASTATIN 40 MG TABLET: 40 | 90 days supply | Qty: 90 | Fill #2

## 2016-08-10 ENCOUNTER — Ambulatory Visit (INDEPENDENT_AMBULATORY_CARE_PROVIDER_SITE_OTHER): Payer: 59 | Admitting: *Deleted

## 2016-08-10 DIAGNOSIS — I639 Cerebral infarction, unspecified: Secondary | ICD-10-CM

## 2016-08-11 NOTE — Progress Notes (Signed)
Carelink Summary Report / Loop Recorder 

## 2016-08-22 LAB — CUP PACEART REMOTE DEVICE CHECK
Date Time Interrogation Session: 20180322010758
MDC IDC PG IMPLANT DT: 20170125

## 2016-09-09 ENCOUNTER — Ambulatory Visit (INDEPENDENT_AMBULATORY_CARE_PROVIDER_SITE_OTHER): Payer: 59 | Admitting: *Deleted

## 2016-09-09 DIAGNOSIS — I639 Cerebral infarction, unspecified: Secondary | ICD-10-CM | POA: Diagnosis not present

## 2016-09-12 NOTE — Progress Notes (Signed)
Carelink Summary Report / Loop Recorder 

## 2016-09-14 LAB — CUP PACEART REMOTE DEVICE CHECK
Implantable Pulse Generator Implant Date: 20170125
MDC IDC SESS DTM: 20180421003935

## 2016-09-26 MED FILL — ESCITALOPRAM 10 MG TABLET: 10 | 90 days supply | Qty: 180 | Fill #1

## 2016-10-04 ENCOUNTER — Other Ambulatory Visit: Payer: Self-pay | Admitting: Internal Medicine

## 2016-10-10 ENCOUNTER — Ambulatory Visit (INDEPENDENT_AMBULATORY_CARE_PROVIDER_SITE_OTHER): Payer: 59 | Admitting: *Deleted

## 2016-10-10 DIAGNOSIS — I639 Cerebral infarction, unspecified: Secondary | ICD-10-CM | POA: Diagnosis not present

## 2016-10-10 NOTE — Progress Notes (Signed)
Carelink Summary Report / Loop Recorder 

## 2016-10-13 LAB — CUP PACEART REMOTE DEVICE CHECK
Implantable Pulse Generator Implant Date: 20170125
MDC IDC SESS DTM: 20180521003656

## 2016-11-07 MED FILL — ATORVASTATIN 40 MG TABLET: 40 | 90 days supply | Qty: 90 | Fill #0

## 2016-11-08 ENCOUNTER — Ambulatory Visit (INDEPENDENT_AMBULATORY_CARE_PROVIDER_SITE_OTHER): Payer: 59 | Admitting: *Deleted

## 2016-11-08 DIAGNOSIS — I639 Cerebral infarction, unspecified: Secondary | ICD-10-CM

## 2016-11-08 MED FILL — ASPIRIN EC 325 MG TABLET: 325 | 90 days supply | Qty: 90 | Fill #0

## 2016-11-10 NOTE — Progress Notes (Signed)
Carelink Summary Report / Loop Recorder 

## 2016-11-16 LAB — CUP PACEART REMOTE DEVICE CHECK
Date Time Interrogation Session: 20180620013649
MDC IDC PG IMPLANT DT: 20170125

## 2016-12-08 ENCOUNTER — Ambulatory Visit (INDEPENDENT_AMBULATORY_CARE_PROVIDER_SITE_OTHER): Payer: 59 | Admitting: *Deleted

## 2016-12-08 DIAGNOSIS — I639 Cerebral infarction, unspecified: Secondary | ICD-10-CM | POA: Diagnosis not present

## 2016-12-13 NOTE — Progress Notes (Signed)
Carelink Summary Report / Loop Recorder 

## 2016-12-23 LAB — CUP PACEART REMOTE DEVICE CHECK
Date Time Interrogation Session: 20180720134907
MDC IDC PG IMPLANT DT: 20170125

## 2016-12-26 MED FILL — ESCITALOPRAM 10 MG TABLET: 10 | 90 days supply | Qty: 180 | Fill #0

## 2017-01-09 ENCOUNTER — Ambulatory Visit (INDEPENDENT_AMBULATORY_CARE_PROVIDER_SITE_OTHER): Payer: 59 | Admitting: *Deleted

## 2017-01-09 DIAGNOSIS — I639 Cerebral infarction, unspecified: Secondary | ICD-10-CM

## 2017-01-11 NOTE — Progress Notes (Signed)
Carelink Summary Report / Loop Recorder 

## 2017-01-13 LAB — CUP PACEART REMOTE DEVICE CHECK
Implantable Pulse Generator Implant Date: 20170125
MDC IDC SESS DTM: 20180819153921

## 2017-01-30 MED FILL — ASPIRIN EC 325 MG TABLET: 325 | 90 days supply | Qty: 90 | Fill #1

## 2017-02-06 MED FILL — ATORVASTATIN 40 MG TABLET: 40 | 90 days supply | Qty: 90 | Fill #1

## 2017-02-07 ENCOUNTER — Ambulatory Visit (INDEPENDENT_AMBULATORY_CARE_PROVIDER_SITE_OTHER): Payer: 59 | Admitting: *Deleted

## 2017-02-07 DIAGNOSIS — I639 Cerebral infarction, unspecified: Secondary | ICD-10-CM | POA: Diagnosis not present

## 2017-02-07 NOTE — Progress Notes (Signed)
Carelink Summary Report / Loop Recorder 

## 2017-02-09 LAB — CUP PACEART REMOTE DEVICE CHECK
Date Time Interrogation Session: 20180918160949
Implantable Pulse Generator Implant Date: 20170125

## 2017-03-09 ENCOUNTER — Ambulatory Visit (INDEPENDENT_AMBULATORY_CARE_PROVIDER_SITE_OTHER): Payer: 59 | Admitting: *Deleted

## 2017-03-09 DIAGNOSIS — I639 Cerebral infarction, unspecified: Secondary | ICD-10-CM

## 2017-03-09 NOTE — Progress Notes (Signed)
Carelink Summary Report / loop Recorder  

## 2017-03-10 LAB — CUP PACEART REMOTE DEVICE CHECK
Date Time Interrogation Session: 20181018161044
MDC IDC PG IMPLANT DT: 20170125

## 2017-03-22 DIAGNOSIS — Z01419 Encounter for gynecological examination (general) (routine) without abnormal findings: Secondary | ICD-10-CM | POA: Diagnosis not present

## 2017-03-22 DIAGNOSIS — N951 Menopausal and female climacteric states: Secondary | ICD-10-CM | POA: Diagnosis not present

## 2017-03-27 MED FILL — ESCITALOPRAM 10 MG TABLET: 10 | 90 days supply | Qty: 180 | Fill #0

## 2017-03-31 ENCOUNTER — Other Ambulatory Visit: Payer: Self-pay | Admitting: Internal Medicine

## 2017-03-31 DIAGNOSIS — Z1231 Encounter for screening mammogram for malignant neoplasm of breast: Secondary | ICD-10-CM

## 2017-04-10 ENCOUNTER — Ambulatory Visit (INDEPENDENT_AMBULATORY_CARE_PROVIDER_SITE_OTHER): Payer: 59 | Admitting: *Deleted

## 2017-04-10 DIAGNOSIS — I639 Cerebral infarction, unspecified: Secondary | ICD-10-CM

## 2017-04-11 NOTE — Progress Notes (Signed)
Carelink Summary Report / Loop Recorder 

## 2017-04-27 LAB — CUP PACEART REMOTE DEVICE CHECK
Date Time Interrogation Session: 20181117194817
MDC IDC PG IMPLANT DT: 20170125

## 2017-04-28 ENCOUNTER — Ambulatory Visit: Payer: 59

## 2017-05-03 MED FILL — ATORVASTATIN 40 MG TABLET: 40 | 90 days supply | Qty: 90 | Fill #0

## 2017-05-03 MED FILL — ASPIRIN EC 325 MG TABLET: 325 | 90 days supply | Qty: 90 | Fill #2

## 2017-05-08 ENCOUNTER — Ambulatory Visit (INDEPENDENT_AMBULATORY_CARE_PROVIDER_SITE_OTHER): Payer: 59 | Admitting: *Deleted

## 2017-05-08 DIAGNOSIS — I639 Cerebral infarction, unspecified: Secondary | ICD-10-CM

## 2017-05-09 NOTE — Progress Notes (Signed)
Carelink Summary Report / Loop Recorder 

## 2017-05-22 ENCOUNTER — Ambulatory Visit
Admission: RE | Admit: 2017-05-22 | Discharge: 2017-05-22 | Disposition: A | Discharge status: Home / Self Care | Payer: 59 | Source: Ambulatory Visit | Attending: Internal Medicine | Admitting: Internal Medicine

## 2017-05-22 DIAGNOSIS — Z1231 Encounter for screening mammogram for malignant neoplasm of breast: Secondary | ICD-10-CM | POA: Diagnosis not present

## 2017-05-29 LAB — CUP PACEART REMOTE DEVICE CHECK
Date Time Interrogation Session: 20181217193750
MDC IDC PG IMPLANT DT: 20170125

## 2017-06-07 ENCOUNTER — Ambulatory Visit (INDEPENDENT_AMBULATORY_CARE_PROVIDER_SITE_OTHER): Payer: 59 | Admitting: *Deleted

## 2017-06-07 DIAGNOSIS — I639 Cerebral infarction, unspecified: Secondary | ICD-10-CM | POA: Diagnosis not present

## 2017-06-08 NOTE — Progress Notes (Signed)
Carelink Summary Report / Loop Recorder 

## 2017-06-16 LAB — CUP PACEART REMOTE DEVICE CHECK
Date Time Interrogation Session: 20190116203936
MDC IDC PG IMPLANT DT: 20170125

## 2017-06-27 MED FILL — ESCITALOPRAM 10 MG TABLET: 10 | 90 days supply | Qty: 180 | Fill #0

## 2017-07-04 DIAGNOSIS — N951 Menopausal and female climacteric states: Secondary | ICD-10-CM | POA: Diagnosis not present

## 2017-07-04 DIAGNOSIS — Z0000 Encounter for general adult medical examination without abnormal findings: Secondary | ICD-10-CM | POA: Diagnosis not present

## 2017-07-04 DIAGNOSIS — E7800 Pure hypercholesterolemia, unspecified: Secondary | ICD-10-CM | POA: Diagnosis not present

## 2017-07-04 DIAGNOSIS — Z8673 Personal history of transient ischemic attack (TIA), and cerebral infarction without residual deficits: Secondary | ICD-10-CM | POA: Diagnosis not present

## 2017-07-04 DIAGNOSIS — E669 Obesity, unspecified: Secondary | ICD-10-CM | POA: Diagnosis not present

## 2017-07-04 DIAGNOSIS — R200 Anesthesia of skin: Secondary | ICD-10-CM | POA: Diagnosis not present

## 2017-07-04 DIAGNOSIS — R739 Hyperglycemia, unspecified: Secondary | ICD-10-CM | POA: Diagnosis not present

## 2017-07-07 ENCOUNTER — Ambulatory Visit (INDEPENDENT_AMBULATORY_CARE_PROVIDER_SITE_OTHER): Payer: 59 | Admitting: *Deleted

## 2017-07-07 DIAGNOSIS — I639 Cerebral infarction, unspecified: Secondary | ICD-10-CM

## 2017-07-10 NOTE — Progress Notes (Signed)
Carelink Summary Report / Loop Recorder 

## 2017-08-01 MED FILL — ASPIRIN EC 325 MG TABLET: 325 | 90 days supply | Qty: 90 | Fill #3

## 2017-08-01 MED FILL — ATORVASTATIN 40 MG TABLET: 40 | 90 days supply | Qty: 90 | Fill #1

## 2017-08-07 LAB — CUP PACEART REMOTE DEVICE CHECK
Date Time Interrogation Session: 20190215210718
MDC IDC PG IMPLANT DT: 20170125

## 2017-08-09 ENCOUNTER — Ambulatory Visit (INDEPENDENT_AMBULATORY_CARE_PROVIDER_SITE_OTHER): Payer: 59 | Admitting: *Deleted

## 2017-08-09 DIAGNOSIS — I639 Cerebral infarction, unspecified: Secondary | ICD-10-CM

## 2017-08-10 NOTE — Progress Notes (Signed)
Carelink Summary Report / Loop Recorder 

## 2017-09-11 ENCOUNTER — Ambulatory Visit (INDEPENDENT_AMBULATORY_CARE_PROVIDER_SITE_OTHER): Payer: 59 | Admitting: *Deleted

## 2017-09-11 DIAGNOSIS — I639 Cerebral infarction, unspecified: Secondary | ICD-10-CM | POA: Diagnosis not present

## 2017-09-12 NOTE — Progress Notes (Signed)
Carelink Summary Report / Loop Recorder 

## 2017-09-16 LAB — CUP PACEART REMOTE DEVICE CHECK
Date Time Interrogation Session: 20190320234054
MDC IDC PG IMPLANT DT: 20170125

## 2017-10-10 LAB — CUP PACEART REMOTE DEVICE CHECK
Date Time Interrogation Session: 20190423000948
Implantable Pulse Generator Implant Date: 20170125

## 2017-10-17 ENCOUNTER — Ambulatory Visit (INDEPENDENT_AMBULATORY_CARE_PROVIDER_SITE_OTHER): Payer: 59 | Admitting: *Deleted

## 2017-10-17 DIAGNOSIS — I639 Cerebral infarction, unspecified: Secondary | ICD-10-CM | POA: Diagnosis not present

## 2017-10-17 NOTE — Progress Notes (Signed)
Carelink Summary Report / Loop Recorder 

## 2017-10-30 MED FILL — ASPIRIN EC 325 MG TABLET: 325 | 90 days supply | Qty: 90 | Fill #0

## 2017-11-07 LAB — CUP PACEART REMOTE DEVICE CHECK
Date Time Interrogation Session: 20190526014033
Implantable Pulse Generator Implant Date: 20170125

## 2017-11-08 MED FILL — ATORVASTATIN 40 MG TABLET: 40 | 90 days supply | Qty: 90 | Fill #2

## 2017-11-16 ENCOUNTER — Ambulatory Visit (INDEPENDENT_AMBULATORY_CARE_PROVIDER_SITE_OTHER): Payer: 59 | Admitting: *Deleted

## 2017-11-16 DIAGNOSIS — I639 Cerebral infarction, unspecified: Secondary | ICD-10-CM

## 2017-11-17 NOTE — Progress Notes (Signed)
Carelink Summary Report / Loop Recorder 

## 2017-12-15 ENCOUNTER — Encounter: Payer: 59 | Admitting: *Deleted

## 2017-12-15 LAB — CUP PACEART REMOTE DEVICE CHECK
Date Time Interrogation Session: 20190628013943
Implantable Pulse Generator Implant Date: 20170125

## 2017-12-19 ENCOUNTER — Ambulatory Visit (INDEPENDENT_AMBULATORY_CARE_PROVIDER_SITE_OTHER): Payer: 59 | Admitting: *Deleted

## 2017-12-19 DIAGNOSIS — I639 Cerebral infarction, unspecified: Secondary | ICD-10-CM

## 2017-12-20 NOTE — Progress Notes (Signed)
Carelink Summary Report / Loop Recorder 

## 2018-01-03 MED FILL — ESCITALOPRAM 10 MG TABLET: 10 | 90 days supply | Qty: 180 | Fill #1

## 2018-01-23 ENCOUNTER — Ambulatory Visit (INDEPENDENT_AMBULATORY_CARE_PROVIDER_SITE_OTHER): Payer: 59 | Admitting: *Deleted

## 2018-01-23 DIAGNOSIS — I639 Cerebral infarction, unspecified: Secondary | ICD-10-CM | POA: Diagnosis not present

## 2018-01-23 NOTE — Progress Notes (Signed)
Carelink Summary Report / Loop Recorder 

## 2018-01-26 MED FILL — ASPIRIN EC 325 MG TABLET: 325 | 90 days supply | Qty: 90 | Fill #1

## 2018-01-30 LAB — CUP PACEART REMOTE DEVICE CHECK
Date Time Interrogation Session: 20190731021104
MDC IDC PG IMPLANT DT: 20170125

## 2018-02-12 MED FILL — ATORVASTATIN 40 MG TABLET: 40 | 90 days supply | Qty: 90 | Fill #3

## 2018-02-13 DIAGNOSIS — M25562 Pain in left knee: Secondary | ICD-10-CM | POA: Diagnosis not present

## 2018-02-13 MED FILL — NAPROXEN 500 MG TABLET: 500 | 30 days supply | Qty: 60 | Fill #0

## 2018-02-16 LAB — CUP PACEART REMOTE DEVICE CHECK
Date Time Interrogation Session: 20190902021127
Implantable Pulse Generator Implant Date: 20170125

## 2018-02-23 ENCOUNTER — Ambulatory Visit (INDEPENDENT_AMBULATORY_CARE_PROVIDER_SITE_OTHER): Payer: 59 | Admitting: *Deleted

## 2018-02-23 DIAGNOSIS — I639 Cerebral infarction, unspecified: Secondary | ICD-10-CM | POA: Diagnosis not present

## 2018-02-24 NOTE — Progress Notes (Signed)
Carelink Summary Report / Loop Recorder 

## 2018-02-27 LAB — CUP PACEART REMOTE DEVICE CHECK
Date Time Interrogation Session: 20191005024449
Implantable Pulse Generator Implant Date: 20170125

## 2018-03-21 DIAGNOSIS — M1712 Unilateral primary osteoarthritis, left knee: Secondary | ICD-10-CM | POA: Diagnosis not present

## 2018-03-28 ENCOUNTER — Ambulatory Visit (INDEPENDENT_AMBULATORY_CARE_PROVIDER_SITE_OTHER): Payer: 59 | Admitting: *Deleted

## 2018-03-28 DIAGNOSIS — I639 Cerebral infarction, unspecified: Secondary | ICD-10-CM | POA: Diagnosis not present

## 2018-03-29 NOTE — Progress Notes (Signed)
Carelink Summary Report / Loop Recorder 

## 2018-04-02 DIAGNOSIS — M1712 Unilateral primary osteoarthritis, left knee: Secondary | ICD-10-CM | POA: Diagnosis not present

## 2018-04-09 DIAGNOSIS — M1712 Unilateral primary osteoarthritis, left knee: Secondary | ICD-10-CM | POA: Diagnosis not present

## 2018-04-24 MED FILL — ASPIRIN EC 325 MG TABLET: 325 | 90 days supply | Qty: 90 | Fill #2

## 2018-04-26 ENCOUNTER — Other Ambulatory Visit (HOSPITAL_COMMUNITY)
Admission: RE | Admit: 2018-04-26 | Discharge: 2018-04-26 | Disposition: A | Discharge status: Home / Self Care | Payer: 59 | Source: Ambulatory Visit | Attending: Obstetrics and Gynecology | Admitting: Obstetrics and Gynecology

## 2018-04-26 ENCOUNTER — Other Ambulatory Visit: Payer: Self-pay | Admitting: Obstetrics and Gynecology

## 2018-04-26 DIAGNOSIS — N951 Menopausal and female climacteric states: Secondary | ICD-10-CM | POA: Diagnosis not present

## 2018-04-26 DIAGNOSIS — Z01411 Encounter for gynecological examination (general) (routine) with abnormal findings: Secondary | ICD-10-CM | POA: Insufficient documentation

## 2018-04-26 DIAGNOSIS — Z01419 Encounter for gynecological examination (general) (routine) without abnormal findings: Secondary | ICD-10-CM | POA: Diagnosis not present

## 2018-04-30 ENCOUNTER — Ambulatory Visit (INDEPENDENT_AMBULATORY_CARE_PROVIDER_SITE_OTHER): Payer: 59

## 2018-04-30 DIAGNOSIS — I639 Cerebral infarction, unspecified: Secondary | ICD-10-CM | POA: Diagnosis not present

## 2018-04-30 LAB — CYTOLOGY - PAP
Diagnosis: NEGATIVE
HPV (WINDOPATH): NOT DETECTED

## 2018-05-02 ENCOUNTER — Other Ambulatory Visit: Payer: Self-pay | Admitting: Internal Medicine

## 2018-05-02 DIAGNOSIS — Z1231 Encounter for screening mammogram for malignant neoplasm of breast: Secondary | ICD-10-CM

## 2018-05-02 NOTE — Progress Notes (Signed)
Carelink Summary Report / Loop Recorder 

## 2018-05-17 ENCOUNTER — Ambulatory Visit (INDEPENDENT_AMBULATORY_CARE_PROVIDER_SITE_OTHER): Payer: Self-pay | Admitting: Family Medicine

## 2018-05-17 VITALS — BP 120/70 | HR 69 | Temp 97.6°F | Resp 16 | Wt 193.0 lb

## 2018-05-17 DIAGNOSIS — N390 Urinary tract infection, site not specified: Secondary | ICD-10-CM

## 2018-05-17 DIAGNOSIS — R300 Dysuria: Secondary | ICD-10-CM

## 2018-05-17 DIAGNOSIS — R319 Hematuria, unspecified: Principal | ICD-10-CM

## 2018-05-17 DIAGNOSIS — T3695XA Adverse effect of unspecified systemic antibiotic, initial encounter: Secondary | ICD-10-CM

## 2018-05-17 DIAGNOSIS — B379 Candidiasis, unspecified: Secondary | ICD-10-CM

## 2018-05-17 LAB — POCT URINALYSIS DIPSTICK
Bilirubin, UA: NEGATIVE
GLUCOSE UA: NEGATIVE
Ketones, UA: NEGATIVE
Nitrite, UA: NEGATIVE
Protein, UA: NEGATIVE
Spec Grav, UA: 1.01 (ref 1.010–1.025)
Urobilinogen, UA: 0.2 E.U./dL
pH, UA: 6 (ref 5.0–8.0)

## 2018-05-17 MED ORDER — FLUCONAZOLE 150 MG PO TABS: 150.0000 mg | ORAL_TABLET | Freq: Once | ORAL | 0 refills | Status: AC

## 2018-05-17 MED ORDER — NITROFURANTOIN MONOHYD MACRO 100 MG PO CAPS: 100.0000 mg | ORAL_CAPSULE | Freq: Two times a day (BID) | ORAL | 0 refills | Status: AC

## 2018-05-17 MED FILL — FLUCONAZOLE 150 MG TABS: 150 | 1 days supply | Qty: 1 | Fill #0

## 2018-05-17 MED FILL — NITROFURANTOIN MONO-MCR 100: 100 | 7 days supply | Qty: 14 | Fill #0

## 2018-05-17 NOTE — Progress Notes (Signed)
Hannah Brady is a 57 y.o. female who presents today with concerns of urinary frequency and pain on urination since 10pm last night. She reports frequent painful urination during the hours of 10 pm-3 am with an episode of blood with pink tinged toilet water and evidence of pink when she wiped, also occasional burning on urination. She denies a history to kidney stones, and reports last UTI was greater than 2 years ago. She reports that she feels her symptoms are consistent with UTI. She is not febrile and denies flank or abdominal pain. She reports being adequately hydrated and urinating after intercourse and every 2-4 hours throughout her day. Patient is overall healthy of note from hx patient has had stroke 3 years ago and has loop recorder to left chest wall is under the care of cardiology- she reports condition is stable and well managed.  Review of Systems  Constitutional: Negative for chills, fever and malaise/fatigue.  HENT: Negative for congestion, ear discharge, ear pain, sinus pain and sore throat.   Eyes: Negative.   Respiratory: Negative for cough, sputum production and shortness of breath.   Cardiovascular: Negative.  Negative for chest pain.  Gastrointestinal: Negative for abdominal pain, diarrhea, nausea and vomiting.  Genitourinary: Positive for dysuria, frequency, hematuria and urgency. Negative for flank pain.  Musculoskeletal: Negative for myalgias.  Skin: Negative.   Neurological: Negative for headaches.  Endo/Heme/Allergies: Negative.   Psychiatric/Behavioral: Negative.     O: Vitals:   05/17/18 0940  BP: 120/70  Pulse: 69  Resp: 16  Temp: 97.6 F (36.4 C)  SpO2: 98%     Physical Exam Vitals signs reviewed.  Constitutional:      Appearance: She is well-developed. She is not toxic-appearing.  HENT:     Head: Normocephalic.     Right Ear: Hearing, ear canal and external ear normal.     Left Ear: Hearing, ear canal and external ear normal.     Nose: Nose  normal.     Mouth/Throat:     Pharynx: Uvula midline.  Neck:     Musculoskeletal: Normal range of motion and neck supple.  Cardiovascular:     Rate and Rhythm: Normal rate and regular rhythm.     Pulses: Normal pulses.     Heart sounds: Normal heart sounds.  Pulmonary:     Effort: Pulmonary effort is normal.     Breath sounds: Normal breath sounds.  Abdominal:     General: Bowel sounds are normal.     Palpations: Abdomen is soft.  Musculoskeletal: Normal range of motion.  Neurological:     Mental Status: She is alert and oriented to person, place, and time.    A: 1. Dysuria    P: Discussed exam findings, diagnosis etiology and medication use and indications reviewed with patient. Follow- Up and discharge instructions provided. No emergent/urgent issues found on exam.  Patient verbalized understanding of information provided and agrees with plan of care (POC), all questions answered.  1. Urinary tract infection with hematuria, site unspecified Exam unremarkable-Blood and leuks on review of UA- discussed with patient the results and the s/s of UTI and pyelonephritis -handout on both conditions provided. - nitrofurantoin, macrocrystal-monohydrate, (MACROBID) 100 MG capsule; Take 1 capsule (100 mg total) by mouth 2 (two) times daily for 7 days.  2. Dysuria - POCT urinalysis dipstick Results for orders placed or performed in visit on 05/17/18 (from the past 24 hour(s))  POCT urinalysis dipstick     Status: Abnormal   Collection  Time: 05/17/18 10:18 AM  Result Value Ref Range   Color, UA LT YELLOW    Clarity, UA CLEAR    Glucose, UA Negative Negative   Bilirubin, UA NEGATIVE    Ketones, UA NEGATIVE    Spec Grav, UA 1.010 1.010 - 1.025   Blood, UA LARGE    pH, UA 6.0 5.0 - 8.0   Protein, UA Negative Negative   Urobilinogen, UA 0.2 0.2 or 1.0 E.U./dL   Nitrite, UA NEGATIVE    Leukocytes, UA Moderate (2+) (A) Negative   Appearance     Odor      - nitrofurantoin,  macrocrystal-monohydrate, (MACROBID) 100 MG capsule; Take 1 capsule (100 mg total) by mouth 2 (two) times daily for 7 days.  3. Antibiotic-induced yeast infection Patient insisted that she experiences a yeast infection after abx. Reviewed s/s and discussed only take it if she needs it. - fluconazole (DIFLUCAN) 150 MG tablet; Take 1 tablet (150 mg total) by mouth once for 1 dose.

## 2018-05-17 NOTE — Patient Instructions (Signed)
Urinary Tract Infection, Adult A urinary tract infection (UTI) is an infection of any part of the urinary tract. The urinary tract includes:  The kidneys.  The ureters.  The bladder.  The urethra. These organs make, store, and get rid of pee (urine) in the body. What are the causes? This is caused by germs (bacteria) in your genital area. These germs grow and cause swelling (inflammation) of your urinary tract. What increases the risk? You are more likely to develop this condition if:  You have a small, thin tube (catheter) to drain pee.  You cannot control when you pee or poop (incontinence).  You are female, and: ? You use these methods to prevent pregnancy: ? A medicine that kills sperm (spermicide). ? A device that blocks sperm (diaphragm). ? You have low levels of a female hormone (estrogen). ? You are pregnant.  You have genes that add to your risk.  You are sexually active.  You take antibiotic medicines.  You have trouble peeing because of: ? A prostate that is bigger than normal, if you are female. ? A blockage in the part of your body that drains pee from the bladder (urethra). ? A kidney stone. ? A nerve condition that affects your bladder (neurogenic bladder). ? Not getting enough to drink. ? Not peeing often enough.  You have other conditions, such as: ? Diabetes. ? A weak disease-fighting system (immune system). ? Sickle cell disease. ? Gout. ? Injury of the spine. What are the signs or symptoms? Symptoms of this condition include:  Needing to pee right away (urgently).  Peeing often.  Peeing small amounts often.  Pain or burning when peeing.  Blood in the pee.  Pee that smells bad or not like normal.  Trouble peeing.  Pee that is cloudy.  Fluid coming from the vagina, if you are female.  Pain in the belly or lower back. Other symptoms include:  Throwing up (vomiting).  No urge to eat.  Feeling mixed up (confused).  Being tired  and grouchy (irritable).  A fever.  Watery poop (diarrhea). How is this treated? This condition may be treated with:  Antibiotic medicine.  Other medicines.  Drinking enough water. Follow these instructions at home:  Medicines  Take over-the-counter and prescription medicines only as told by your doctor.  If you were prescribed an antibiotic medicine, take it as told by your doctor. Do not stop taking it even if you start to feel better. General instructions  Make sure you: ? Pee until your bladder is empty. ? Do not hold pee for a long time. ? Empty your bladder after sex. ? Wipe from front to back after pooping if you are a female. Use each tissue one time when you wipe.  Drink enough fluid to keep your pee pale yellow.  Keep all follow-up visits as told by your doctor. This is important. Contact a doctor if:  You do not get better after 1-2 days.  Your symptoms go away and then come back. Get help right away if:  You have very bad back pain.  You have very bad pain in your lower belly.  You have a fever.  You are sick to your stomach (nauseous).  You are throwing up. Summary  A urinary tract infection (UTI) is an infection of any part of the urinary tract.  This condition is caused by germs in your genital area.  There are many risk factors for a UTI. These include having a small, thin   tube to drain pee and not being able to control when you pee or poop.  Treatment includes antibiotic medicines for germs.  Drink enough fluid to keep your pee pale yellow. This information is not intended to replace advice given to you by your health care provider. Make sure you discuss any questions you have with your health care provider. Document Released: 10/26/2007 Document Revised: 11/16/2017 Document Reviewed: 11/16/2017 Elsevier Interactive Patient Education  2019 Else[employer]. Pyelonephritis, Adult Pyelonephritis is a kidney infection. The kidneys are the  organs that filter a person's blood and move waste out of the bloodstream and into the urine. Urine passes from the kidneys, through the ureters, and into the bladder. There are two main types of pyelonephritis:  Infections that come on quickly without any warning (acute pyelonephritis).  Infections that last for a long period of time (chronic pyelonephritis). In most cases, the infection clears up with treatment and does not cause further problems. More severe infections or chronic infections can sometimes spread to the bloodstream or lead to other problems with the kidneys. What are the causes? This condition is usually caused by:  Bacteria traveling from the bladder to the kidney through infected urine. The urine in the bladder can become infected with bacteria from: ? Bladder infection (cystitis). ? Inflammation of the prostate gland (prostatitis). ? Sexual intercourse, in females.  Bacteria traveling from the bloodstream to the kidney. What increases the risk? This condition is more likely to develop in:  Pregnant women.  Older people.  People who have diabetes.  People who have kidney stones or bladder stones.  People who have other abnormalities of the kidney or ureter.  People who have a catheter placed in the bladder.  People who have cancer.  People who are sexually active.  Women who use spermicides.  People who have had a prior urinary tract infection. What are the signs or symptoms? Symptoms of this condition include:  Frequent urination.  Strong or persistent urge to urinate.  Burning or stinging when urinating.  Abdominal pain.  Back pain.  Pain in the side or flank area.  Fever.  Chills.  Blood in the urine, or dark urine.  Nausea.  Vomiting. How is this diagnosed? This condition may be diagnosed based on:  Medical history and physical exam.  Urine tests.  Blood tests. You may also have imaging tests of the kidneys, such as an  ultrasound or CT scan. How is this treated? Treatment for this condition may depend on the severity of the infection.  If the infection is mild and is found early, you may be treated with antibiotic medicines taken by mouth. You will need to drink fluids to remain hydrated.  If the infection is more severe, you may need to stay in the hospital and receive antibiotics given directly into a vein through an IV tube. You may also need to receive fluids through an IV tube if you are not able to remain hydrated. After your hospital stay, you may need to take oral antibiotics for a period of time. Other treatments may be required, depending on the cause of the infection. Follow these instructions at home: Medicines  Take over-the-counter and prescription medicines only as told by your health care provider.  If you were prescribed an antibiotic medicine, take it as told by your health care provider. Do not stop taking the antibiotic even if you start to feel better. General instructions  Drink enough fluid to keep your urine clear or pale  yellow.  Avoid caffeine, tea, and carbonated beverages. They tend to irritate the bladder.  Urinate often. Avoid holding in urine for long periods of time.  Urinate before and after sex.  After a bowel movement, women should cleanse from front to back. Use each tissue only once.  Keep all follow-up visits as told by your health care provider. This is important. Contact a health care provider if:  Your symptoms do not get better after 2 days of treatment.  Your symptoms get worse.  You have a fever. Get help right away if:  You are unable to take your antibiotics or fluids.  You have shaking chills.  You vomit.  You have severe flank or back pain.  You have extreme weakness or fainting. This information is not intended to replace advice given to you by your health care provider. Make sure you discuss any questions you have with your health care  provider. Document Released: 05/09/2005 Document Revised: 10/15/2015 Document Reviewed: 09/01/2014 Elsevier Interactive Patient Education  [employer].

## 2018-05-19 LAB — CUP PACEART REMOTE DEVICE CHECK
Date Time Interrogation Session: 20191107024105
MDC IDC PG IMPLANT DT: 20170125

## 2018-05-22 MED FILL — ATORVASTATIN 40 MG TABLET: 40 | 90 days supply | Qty: 90 | Fill #0

## 2018-06-04 ENCOUNTER — Ambulatory Visit (INDEPENDENT_AMBULATORY_CARE_PROVIDER_SITE_OTHER): Payer: 59

## 2018-06-04 DIAGNOSIS — I639 Cerebral infarction, unspecified: Secondary | ICD-10-CM | POA: Diagnosis not present

## 2018-06-05 NOTE — Progress Notes (Signed)
Carelink Summary Report / Loop Recorder 

## 2018-06-07 ENCOUNTER — Ambulatory Visit
Admission: RE | Admit: 2018-06-07 | Discharge: 2018-06-07 | Disposition: A | Discharge status: Home / Self Care | Payer: 59 | Source: Ambulatory Visit | Attending: Internal Medicine | Admitting: Internal Medicine

## 2018-06-07 DIAGNOSIS — Z1231 Encounter for screening mammogram for malignant neoplasm of breast: Secondary | ICD-10-CM

## 2018-06-07 LAB — CUP PACEART REMOTE DEVICE CHECK
Implantable Pulse Generator Implant Date: 20170125
MDC IDC SESS DTM: 20200112033736

## 2018-06-08 ENCOUNTER — Ambulatory Visit: Payer: 59

## 2018-06-10 LAB — CUP PACEART REMOTE DEVICE CHECK
Date Time Interrogation Session: 20191210030616
Implantable Pulse Generator Implant Date: 20170125

## 2018-06-29 DIAGNOSIS — J101 Influenza due to other identified influenza virus with other respiratory manifestations: Secondary | ICD-10-CM | POA: Diagnosis not present

## 2018-06-29 MED FILL — OSELTAMIVIR PHOS 75 MG CAP: 75 | 5 days supply | Qty: 10 | Fill #0

## 2018-06-29 MED FILL — HYDROCODONE-CHLORPHEN ER SU: 10-8 | 7 days supply | Qty: 70 | Fill #0

## 2018-07-04 MED FILL — ESCITALOPRAM 10 MG TABLET: 10 | 90 days supply | Qty: 180 | Fill #0

## 2018-07-05 ENCOUNTER — Ambulatory Visit (INDEPENDENT_AMBULATORY_CARE_PROVIDER_SITE_OTHER): Payer: 59

## 2018-07-05 DIAGNOSIS — I639 Cerebral infarction, unspecified: Secondary | ICD-10-CM

## 2018-07-06 LAB — CUP PACEART REMOTE DEVICE CHECK
Date Time Interrogation Session: 20200214051152
Implantable Pulse Generator Implant Date: 20170125

## 2018-07-17 NOTE — Progress Notes (Signed)
Carelink Summary Report / Loop Recorder 

## 2018-07-23 MED FILL — ASPIRIN EC 325 MG TABLET: 325 | 90 days supply | Qty: 90 | Fill #3

## 2018-08-07 ENCOUNTER — Other Ambulatory Visit: Payer: Self-pay

## 2018-08-07 ENCOUNTER — Ambulatory Visit (INDEPENDENT_AMBULATORY_CARE_PROVIDER_SITE_OTHER): Payer: 59 | Admitting: *Deleted

## 2018-08-07 DIAGNOSIS — I639 Cerebral infarction, unspecified: Secondary | ICD-10-CM | POA: Diagnosis not present

## 2018-08-08 LAB — CUP PACEART REMOTE DEVICE CHECK
Date Time Interrogation Session: 20200318113845
Implantable Pulse Generator Implant Date: 20170125

## 2018-08-14 IMAGING — MR MR KNEE*L* W/O CM
4 of 5 series · 19 of 40 positions shown · non-contrast
Comparison: None.

CLINICAL DATA: Knee pain and swelling for 10 weeks following a
twisting injury.

EXAM:
MRI OF THE LEFT KNEE WITHOUT CONTRAST
TECHNIQUE: Multiplanar, multisequence MR imaging of the knee was performed. No
intravenous contrast was administered.

[Series 5: PD fat-sat · axial · 3.0mm · 0.31mm/px · z∈[-109,+6]mm · 8 of 34 slices shown (1 of 3)]
[im 1/34]
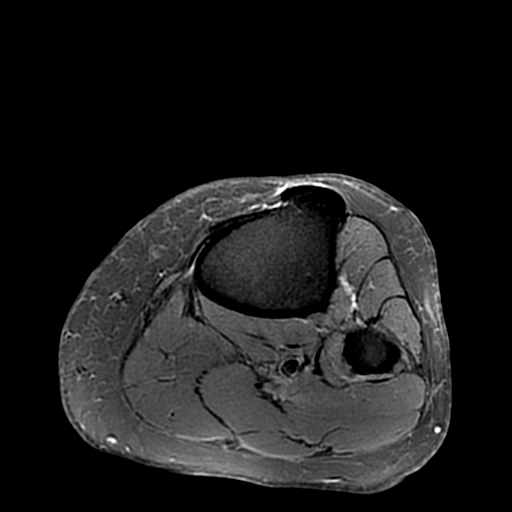
[im 5/34]
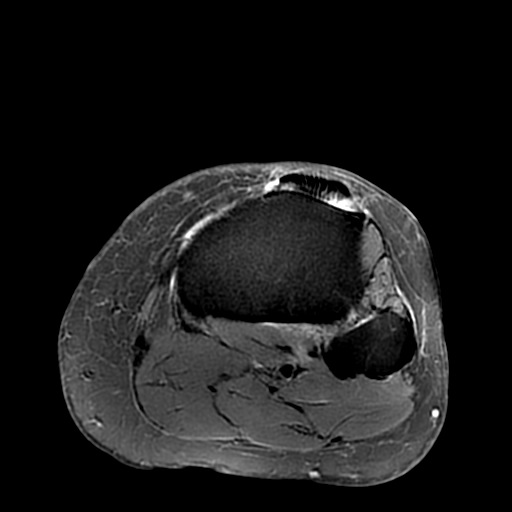
[im 10/34]
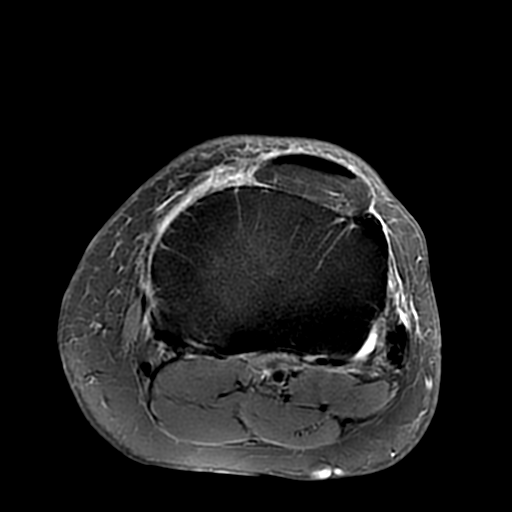
[im 15/34]
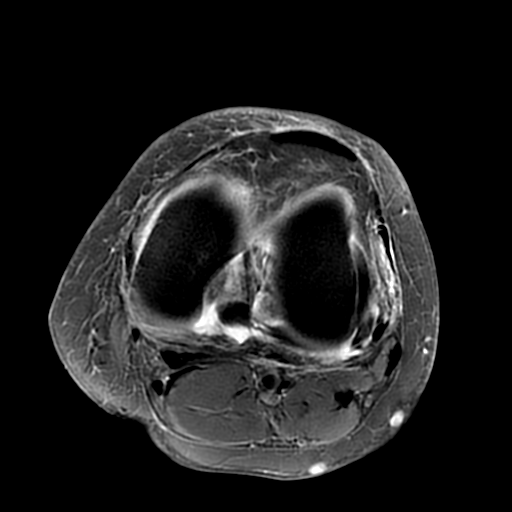
[im 19/34]
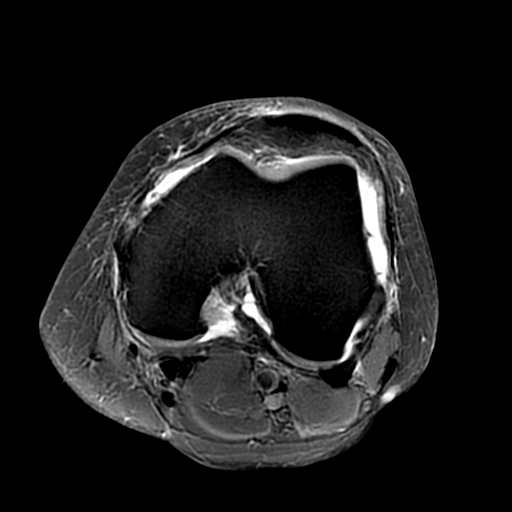
[im 24/34]
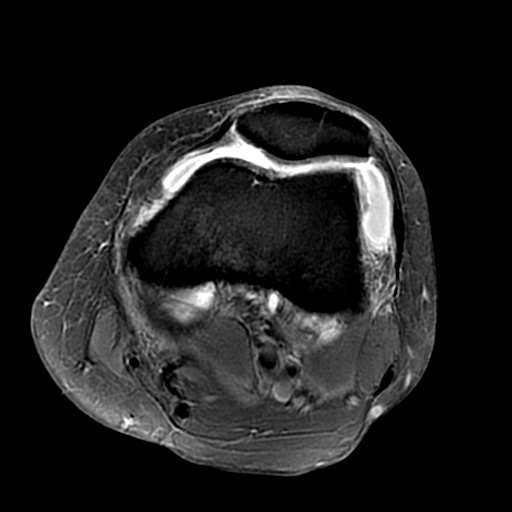
[im 29/34]
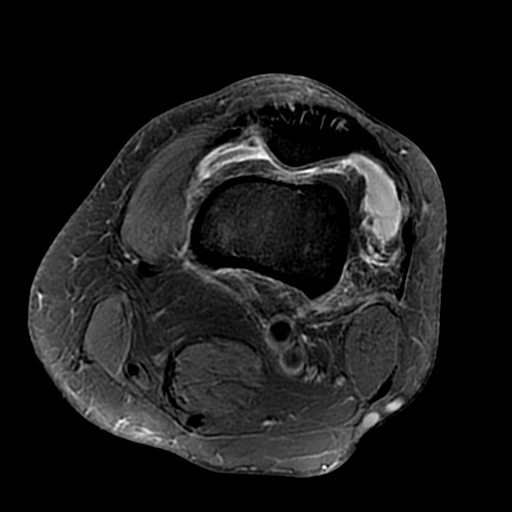
[im 34/34]
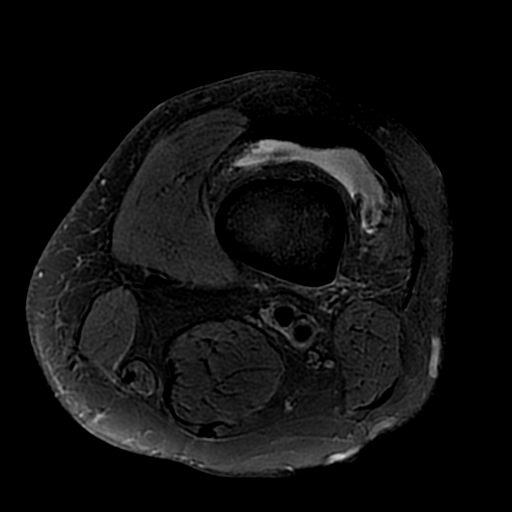

[Series 6: T2 fat-sat · coronal · 3.0mm · 0.29mm/px · 3 of 30 slices shown]
[im 5/30]
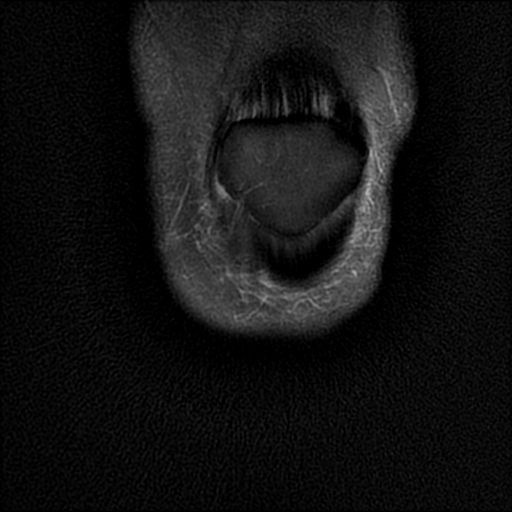
[im 17/30]
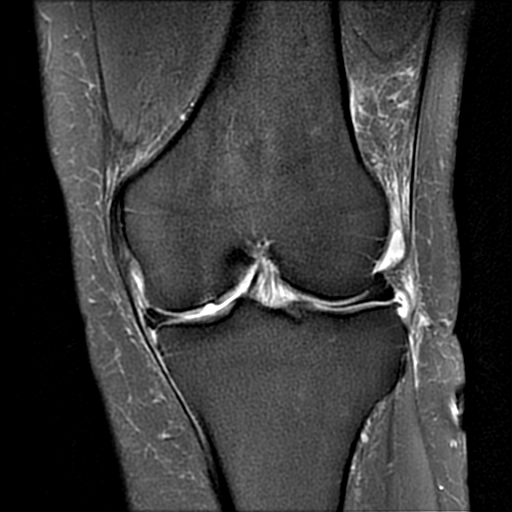
[im 25/30]
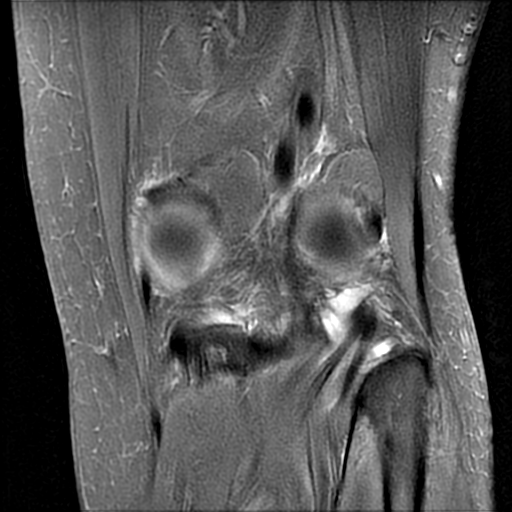

[Series 8: PD fat-sat · coronal · 3.0mm · 0.29mm/px · 5 of 30 slices shown (2 of 3)]
[im 1/30]
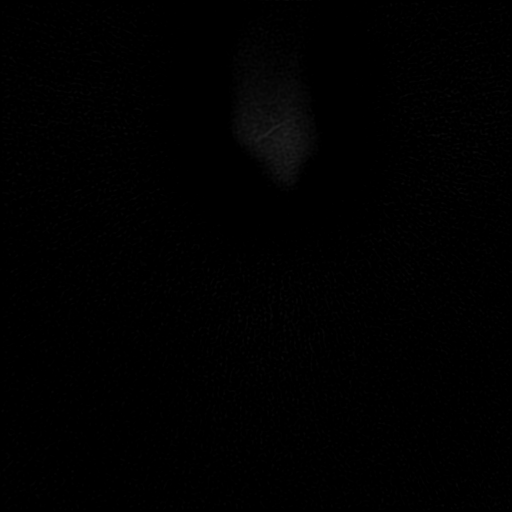
[im 5/30]
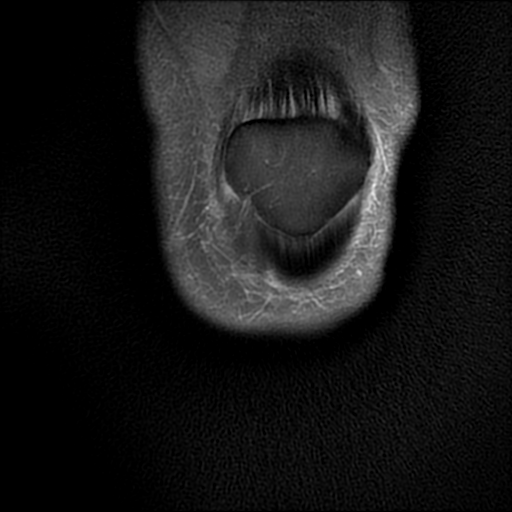
[im 9/30]
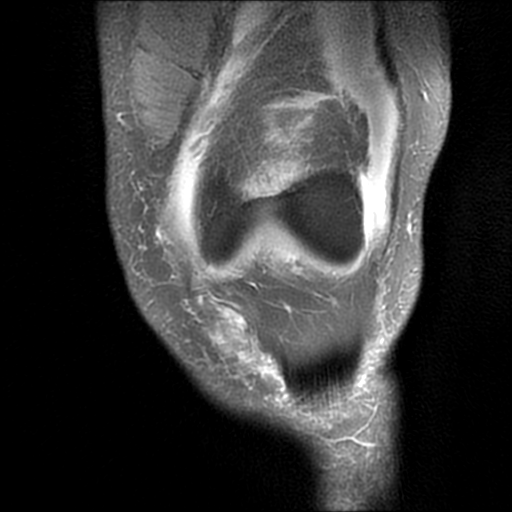
[im 17/30]
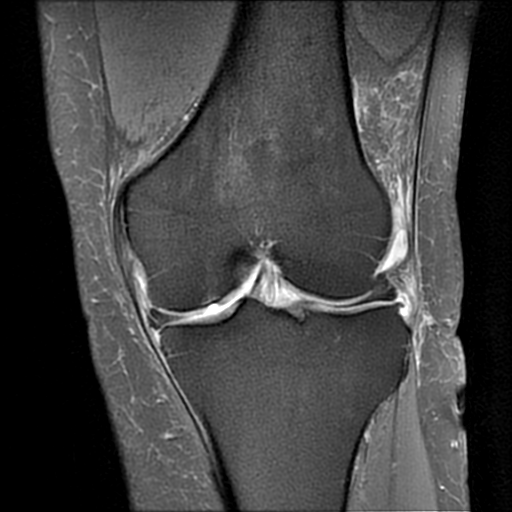
[im 25/30]
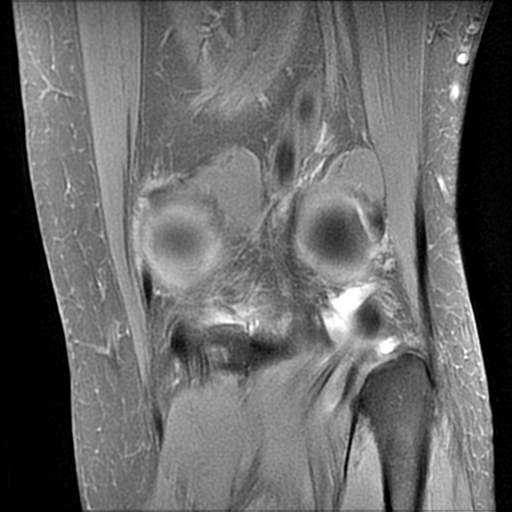

[Series 9: PD fat-sat · sagittal · 3.0mm · 0.31mm/px · 3 of 32 slices shown (3 of 3)]
[im 5/32]
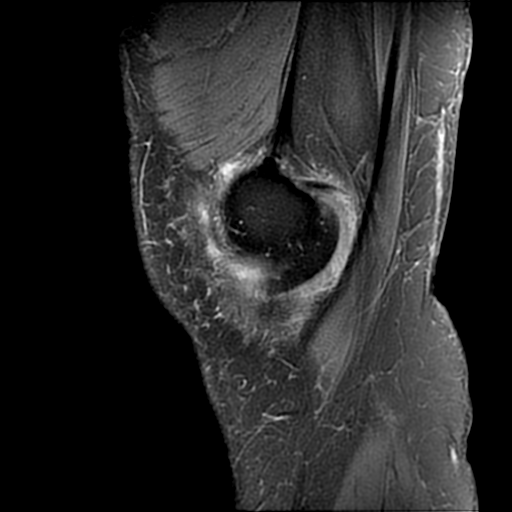
[im 18/32]
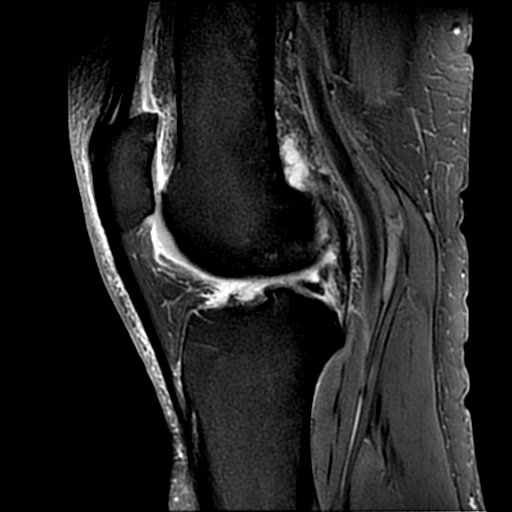
[im 27/32]
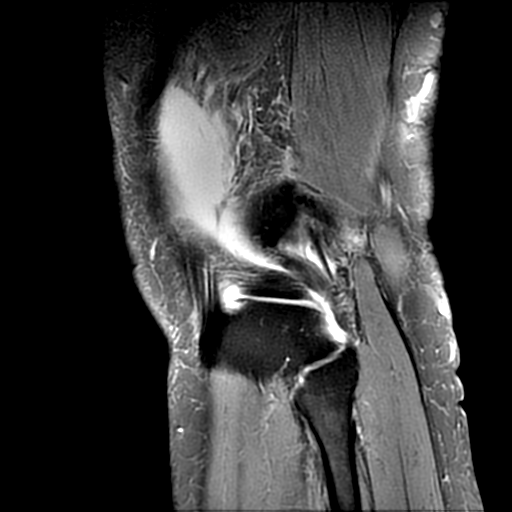

[19 of 40 positions shown; findings below may reference images not displayed]

FINDINGS: MENISCI

Medial meniscus: Intrameniscal degenerative type signal changes but
no discrete tear.

Lateral meniscus: Intrasubstance degenerative type signal changes
but no discrete tear.

LIGAMENTS

Cruciates:  Suspect ACL sprain.  No rupture.  The PCL is intact.

Collaterals:  Fibular collateral ligament sprain.  No complete tear.

Proximal MCL sprain. No tear/rupture. Mild MCL and pes anserine
bursitis.

CARTILAGE

Patellofemoral: Moderate to advanced degenerative chondrosis/
chondromalacia most significant at the patellar apex with there
appears to be un area of full thickness cartilage loss.

Medial: Moderate degenerative chondrosis. Small full-thickness
cartilage defect involving the medial femoral condyle.

Lateral:  Mild degenerative chondrosis.

Joint: Moderate-sized joint effusion and mild synovitis. Superior
and medial patellar plica are noted.

Popliteal Fossa:  No popliteal mass or Baker's cyst.

Extensor Mechanism: The patella retinacular structures are intact
and the quadriceps and patellar tendons are intact.

Bones:  No acute bony findings.  No bone contusions or marrow edema.

Other: The knee musculature appears normal.
IMPRESSION: 1. Intrameniscal degenerative type signal changes but no discrete
meniscal tear.
2. ACL MCL and LCL sprains.  No complete tear/rupture.
3. MCL and pes anserine bursitis.
4. Tricompartmental degenerative changes most significant at the
patellofemoral joint.
5. Moderate-sized joint effusion and mild synovitis.

## 2018-08-15 NOTE — Progress Notes (Signed)
Carelink Summary Report / Loop Recorder 

## 2018-09-04 MED FILL — ATORVASTATIN 40 MG TABLET: 40 | 90 days supply | Qty: 90 | Fill #0 | Status: TO

## 2018-09-10 ENCOUNTER — Other Ambulatory Visit: Payer: Self-pay

## 2018-09-10 ENCOUNTER — Ambulatory Visit (INDEPENDENT_AMBULATORY_CARE_PROVIDER_SITE_OTHER): Payer: 59 | Admitting: *Deleted

## 2018-09-10 DIAGNOSIS — I639 Cerebral infarction, unspecified: Secondary | ICD-10-CM | POA: Diagnosis not present

## 2018-09-10 LAB — CUP PACEART REMOTE DEVICE CHECK
Date Time Interrogation Session: 20200420134206
Implantable Pulse Generator Implant Date: 20170125

## 2018-09-19 NOTE — Progress Notes (Signed)
Carelink Summary Report / Loop Recorder 

## 2018-10-13 MED FILL — ASPIRIN EC 325 MG TABLET: 325 | 90 days supply | Qty: 90 | Fill #0

## 2018-10-15 LAB — CUP PACEART REMOTE DEVICE CHECK
Date Time Interrogation Session: 20200523134220
Implantable Pulse Generator Implant Date: 20170125

## 2018-10-16 ENCOUNTER — Ambulatory Visit (INDEPENDENT_AMBULATORY_CARE_PROVIDER_SITE_OTHER): Payer: 59 | Admitting: *Deleted

## 2018-10-16 DIAGNOSIS — I639 Cerebral infarction, unspecified: Secondary | ICD-10-CM

## 2018-10-16 MED FILL — ESCITALOPRAM 10 MG TABLET: 10 | 90 days supply | Qty: 180 | Fill #1

## 2018-10-29 NOTE — Progress Notes (Signed)
Carelink Summary Report / Loop Recorder 

## 2018-11-15 ENCOUNTER — Ambulatory Visit (INDEPENDENT_AMBULATORY_CARE_PROVIDER_SITE_OTHER): Payer: 59 | Admitting: *Deleted

## 2018-11-15 DIAGNOSIS — I639 Cerebral infarction, unspecified: Secondary | ICD-10-CM | POA: Diagnosis not present

## 2018-11-15 LAB — CUP PACEART REMOTE DEVICE CHECK
Date Time Interrogation Session: 20200625133515
Implantable Pulse Generator Implant Date: 20170125

## 2018-11-16 MED FILL — ATORVASTATIN 40 MG TABLET: 40 | 90 days supply | Qty: 90 | Fill #0

## 2018-11-26 NOTE — Progress Notes (Signed)
Carelink Summary Report / Loop Recorder 

## 2018-12-07 MED FILL — ATORVASTATIN 40 MG TABLET: 40 | 90 days supply | Qty: 90 | Fill #0

## 2018-12-18 ENCOUNTER — Ambulatory Visit (INDEPENDENT_AMBULATORY_CARE_PROVIDER_SITE_OTHER): Payer: 59 | Admitting: *Deleted

## 2018-12-18 DIAGNOSIS — I639 Cerebral infarction, unspecified: Secondary | ICD-10-CM | POA: Diagnosis not present

## 2018-12-18 LAB — CUP PACEART REMOTE DEVICE CHECK
Date Time Interrogation Session: 20200728145840
Implantable Pulse Generator Implant Date: 20170125

## 2018-12-31 NOTE — Progress Notes (Signed)
Carelink Summary Report / Loop Recorder 

## 2019-01-21 ENCOUNTER — Ambulatory Visit (INDEPENDENT_AMBULATORY_CARE_PROVIDER_SITE_OTHER): Payer: 59 | Admitting: *Deleted

## 2019-01-21 DIAGNOSIS — I639 Cerebral infarction, unspecified: Secondary | ICD-10-CM | POA: Diagnosis not present

## 2019-01-21 LAB — CUP PACEART REMOTE DEVICE CHECK
Date Time Interrogation Session: 20200830164126
Implantable Pulse Generator Implant Date: 20170125

## 2019-01-23 MED FILL — ASPIRIN EC 325 MG TABLET: 325 | 90 days supply | Qty: 90 | Fill #1

## 2019-01-31 ENCOUNTER — Telehealth: Payer: Self-pay | Admitting: Emergency Medicine

## 2019-01-31 NOTE — Telephone Encounter (Signed)
LMOMCruz Brady at [facility] will need to be scheduled for extraction

## 2019-02-04 NOTE — Telephone Encounter (Signed)
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

## 2019-02-06 NOTE — Progress Notes (Signed)
Carelink Summary Report / Loop Recorder 

## 2019-02-20 ENCOUNTER — Ambulatory Visit: Payer: 59 | Admitting: Internal Medicine

## 2019-02-20 ENCOUNTER — Other Ambulatory Visit: Payer: Self-pay

## 2019-02-20 VITALS — Ht 64.0 in | Wt 190.4 lb

## 2019-02-20 DIAGNOSIS — I639 Cerebral infarction, unspecified: Secondary | ICD-10-CM

## 2019-02-20 DIAGNOSIS — Z959 Presence of cardiac and vascular implant and graft, unspecified: Secondary | ICD-10-CM

## 2019-02-20 MED ORDER — ASPIRIN EC 81 MG PO TBEC: 81.0000 mg | DELAYED_RELEASE_TABLET | Freq: Every day | ORAL | 3 refills | Status: AC

## 2019-02-20 NOTE — Progress Notes (Signed)
      Patient Care Team: Lanice Shirts, MD as PCP - General (Internal Medicine)   HPI  Hannah Brady is a 58 y.o. female S/p CVA 1/17 at which time she received an ILR; the device has now reached end of service   She is here for explantation  Meds include ASA and statins.    Records and Results Reviewed**   Past Medical History:  Diagnosis Date  . Complication of anesthesia   . Hyperlipidemia   . PONV (postoperative nausea and vomiting)   . Stroke [facility])    no defecits    Past Surgical History:  Procedure Laterality Date  . BREAST EXCISIONAL BIOPSY Right   . CESAREAN SECTION    . CHONDROPLASTY Left 03/25/2016   Procedure: CHONDROPLASTY;  Surgeon: Melrose Nakayama, MD;  Location: [place];  Service: Orthopedics;  Laterality: Left;  . EP IMPLANTABLE DEVICE N/A 06/17/2015   Procedure: Loop Recorder Insertion;  Surgeon: Deboraha Sprang, MD;  Location: [place] CV LAB;  Service: Cardiovascular;  Laterality: N/A;  . KNEE ARTHROSCOPY Left 03/25/2016   Procedure: ARTHROSCOPY KNEE;  Surgeon: Melrose Nakayama, MD;  Location: [place];  Service: Orthopedics;  Laterality: Left;  . KNEE ARTHROSCOPY WITH MEDIAL MENISECTOMY Left 03/25/2016   Procedure: KNEE ARTHROSCOPY WITH MEDIAL MENISECTOMY;  Surgeon: Melrose Nakayama, MD;  Location: [place];  Service: Orthopedics;  Laterality: Left;  . TEE WITHOUT CARDIOVERSION N/A 06/17/2015   Procedure: TRANSESOPHAGEAL ECHOCARDIOGRAM (TEE);  Surgeon: Pixie Casino, MD;  Location: [facility] ENDOSCOPY;  Service: Cardiovascular;  Laterality: N/A;    Current Meds  Medication Sig  . atorvastatin (LIPITOR) 40 MG tablet Take 1 tablet (40 mg total) by mouth daily at 6 PM.  . escitalopram (LEXAPRO) 5 MG tablet Take 20 mg by mouth daily.  . Multiple Vitamin (MULTIVITAMIN WITH MINERALS) TABS tablet Take 1 tablet by mouth daily.  . [DISCONTINUED] aspirin 325 MG tablet Take 1 tablet (325 mg total) by mouth  daily.    No Known Allergies    Review of Systems negative except from HPI and PMH  Physical Exam Ht 5\' 4"  (1.626 m)   Wt 190 lb 6.4 oz (86.4 kg)   BMI 32.68 kg/m  Well developed and well nourished in no acute distress HENT normal E scleral and icterus clear Neck Supple JVP flat; carotids brisk and full Clear to ausculation regular rate and rhythm, no murmurs gallops or rub Soft with active bowel sounds No clubbing cyanosis  Edema Alert and oriented, grossly normal motor and sensory function Skin Warm and Dry   Hannah Brady [id number]  [id number]  Preop Dx: Cryptogenic Stroke previous loop recorder Postop Dx same/  Procedure: explantation of loop recorder  Local anesthesia and incision, loop recorder removed and steri strip dressing applied Pt tolerated without difficulty  Cx: None      Virl Axe, MD 02/20/2019 6:17 PM       Current medicines are reviewed at length with the patient today .  The patient does not  have concerns regarding medicines.

## 2019-02-20 NOTE — Patient Instructions (Addendum)
Medication Instructions:  Your physician has recommended you make the following change in your medication:  1. DECREASE Aspirin to 81 mg once daily  * If you need a refill on your cardiac medications before your next appointment, please call your pharmacy.   Labwork: None ordered  Testing/Procedures: None ordered  Follow-Up: The office will contact you to arrange a virtual visit with our device clinic for a wound check.  No follow up is needed at this time with Dr. Caryl Comes.  He will see you on an as needed basis.   Thank you for choosing [facility]!!

## 2019-02-23 LAB — CUP PACEART REMOTE DEVICE CHECK
Date Time Interrogation Session: 20201002164118
Implantable Pulse Generator Implant Date: 20170125

## 2019-03-05 ENCOUNTER — Telehealth (INDEPENDENT_AMBULATORY_CARE_PROVIDER_SITE_OTHER): Payer: 59 | Admitting: Student

## 2019-03-05 ENCOUNTER — Other Ambulatory Visit: Payer: Self-pay

## 2019-03-05 NOTE — Progress Notes (Addendum)
  Visually assessed patients ILR explant site over video chat. Appears stable, without redness and edema; Pt confirms.   Pt instructed to watch out for bleeding, swelling, drainage, fever, or chills, and knows to call back immediately if any occurs.   Pt marked as inactive in [place], and discontinued in [place].   Legrand Como [address]" [place], PA-C  03/05/2019 11:53 AM

## 2019-03-07 MED FILL — ATORVASTATIN 40 MG TABLET: 40 | 90 days supply | Qty: 90 | Fill #1

## 2019-03-19 DIAGNOSIS — H16143 Punctate keratitis, bilateral: Secondary | ICD-10-CM | POA: Diagnosis not present

## 2019-03-19 MED FILL — FLUOROMETHOLONE 0.1% DROPS: 0.1 | 13 days supply | Qty: 5 | Fill #0

## 2019-05-07 ENCOUNTER — Other Ambulatory Visit: Payer: Self-pay | Admitting: Internal Medicine

## 2019-05-07 DIAGNOSIS — Z1231 Encounter for screening mammogram for malignant neoplasm of breast: Secondary | ICD-10-CM

## 2019-05-23 DIAGNOSIS — Z01419 Encounter for gynecological examination (general) (routine) without abnormal findings: Secondary | ICD-10-CM | POA: Diagnosis not present

## 2019-05-23 DIAGNOSIS — R232 Flushing: Secondary | ICD-10-CM | POA: Diagnosis not present

## 2019-05-23 MED FILL — ESCITALOPRAM 10 MG TABLET: 10 | 90 days supply | Qty: 180 | Fill #0

## 2019-06-03 MED FILL — ATORVASTATIN 40 MG TABLET: 40 | 90 days supply | Qty: 90 | Fill #0

## 2019-06-25 ENCOUNTER — Ambulatory Visit
Admission: RE | Admit: 2019-06-25 | Discharge: 2019-06-25 | Disposition: A | Discharge status: Home / Self Care | Payer: 59 | Source: Ambulatory Visit | Attending: Internal Medicine | Admitting: Internal Medicine

## 2019-06-25 ENCOUNTER — Other Ambulatory Visit: Payer: Self-pay

## 2019-06-25 DIAGNOSIS — Z1231 Encounter for screening mammogram for malignant neoplasm of breast: Secondary | ICD-10-CM

## 2019-07-02 DIAGNOSIS — H5213 Myopia, bilateral: Secondary | ICD-10-CM | POA: Diagnosis not present

## 2019-07-02 DIAGNOSIS — H52222 Regular astigmatism, left eye: Secondary | ICD-10-CM | POA: Diagnosis not present

## 2019-08-22 DIAGNOSIS — M1712 Unilateral primary osteoarthritis, left knee: Secondary | ICD-10-CM | POA: Diagnosis not present

## 2019-09-04 DIAGNOSIS — M1712 Unilateral primary osteoarthritis, left knee: Secondary | ICD-10-CM | POA: Diagnosis not present

## 2019-09-11 DIAGNOSIS — M1712 Unilateral primary osteoarthritis, left knee: Secondary | ICD-10-CM | POA: Diagnosis not present

## 2019-09-18 DIAGNOSIS — M1711 Unilateral primary osteoarthritis, right knee: Secondary | ICD-10-CM | POA: Diagnosis not present

## 2019-09-30 ENCOUNTER — Other Ambulatory Visit: Payer: 59

## 2019-10-01 DIAGNOSIS — Z1159 Encounter for screening for other viral diseases: Secondary | ICD-10-CM | POA: Diagnosis not present

## 2019-10-17 MED FILL — ATORVASTATIN 40 MG TABLET: 40 | 90 days supply | Qty: 90 | Fill #1

## 2020-01-15 DIAGNOSIS — M1712 Unilateral primary osteoarthritis, left knee: Secondary | ICD-10-CM | POA: Diagnosis not present

## 2020-02-05 ENCOUNTER — Telehealth: Payer: 59 | Admitting: Nurse Practitioner

## 2020-02-05 DIAGNOSIS — J069 Acute upper respiratory infection, unspecified: Secondary | ICD-10-CM | POA: Diagnosis not present

## 2020-02-05 MED ORDER — FLUTICASONE PROPIONATE 50 MCG/ACT NA SUSP: 2.0000 | Freq: Every day | NASAL | 6 refills | Status: DC

## 2020-02-05 NOTE — Progress Notes (Signed)

## 2020-03-16 MED FILL — ESCITALOPRAM 10 MG TABLET: 10 | 90 days supply | Qty: 180 | Fill #1

## 2020-04-22 MED FILL — ATORVASTATIN 40 MG TABLET: 40 | 90 days supply | Qty: 90 | Fill #2

## 2020-04-28 DIAGNOSIS — Z01419 Encounter for gynecological examination (general) (routine) without abnormal findings: Secondary | ICD-10-CM | POA: Diagnosis not present

## 2020-04-28 DIAGNOSIS — Z131 Encounter for screening for diabetes mellitus: Secondary | ICD-10-CM | POA: Diagnosis not present

## 2020-04-28 DIAGNOSIS — Z130 Encounter for screening for diseases of the blood and blood-forming organs and certain disorders involving the immune mechanism: Secondary | ICD-10-CM | POA: Diagnosis not present

## 2020-04-28 DIAGNOSIS — Z13220 Encounter for screening for lipoid disorders: Secondary | ICD-10-CM | POA: Diagnosis not present

## 2020-04-28 DIAGNOSIS — N951 Menopausal and female climacteric states: Secondary | ICD-10-CM | POA: Diagnosis not present

## 2020-09-09 ENCOUNTER — Other Ambulatory Visit: Payer: Self-pay

## 2020-09-09 ENCOUNTER — Ambulatory Visit (INDEPENDENT_AMBULATORY_CARE_PROVIDER_SITE_OTHER): Payer: 59 | Admitting: Family Medicine

## 2020-09-09 ENCOUNTER — Encounter (INDEPENDENT_AMBULATORY_CARE_PROVIDER_SITE_OTHER): Payer: Self-pay | Admitting: Family Medicine

## 2020-09-09 VITALS — BP 131/71 | HR 64 | Temp 98.0°F | Ht 64.0 in | Wt 184.0 lb

## 2020-09-09 DIAGNOSIS — R0602 Shortness of breath: Secondary | ICD-10-CM

## 2020-09-09 DIAGNOSIS — Z9189 Other specified personal risk factors, not elsewhere classified: Secondary | ICD-10-CM | POA: Diagnosis not present

## 2020-09-09 DIAGNOSIS — E669 Obesity, unspecified: Secondary | ICD-10-CM | POA: Diagnosis not present

## 2020-09-09 DIAGNOSIS — E559 Vitamin D deficiency, unspecified: Secondary | ICD-10-CM | POA: Diagnosis not present

## 2020-09-09 DIAGNOSIS — E785 Hyperlipidemia, unspecified: Secondary | ICD-10-CM | POA: Diagnosis not present

## 2020-09-09 DIAGNOSIS — I10 Essential (primary) hypertension: Secondary | ICD-10-CM | POA: Diagnosis not present

## 2020-09-09 DIAGNOSIS — R5383 Other fatigue: Secondary | ICD-10-CM

## 2020-09-09 DIAGNOSIS — Z6831 Body mass index (BMI) 31.0-31.9, adult: Secondary | ICD-10-CM | POA: Diagnosis not present

## 2020-09-09 DIAGNOSIS — Z1331 Encounter for screening for depression: Secondary | ICD-10-CM | POA: Diagnosis not present

## 2020-09-09 DIAGNOSIS — R739 Hyperglycemia, unspecified: Secondary | ICD-10-CM

## 2020-09-09 DIAGNOSIS — Z0289 Encounter for other administrative examinations: Secondary | ICD-10-CM

## 2020-09-10 LAB — CBC WITH DIFFERENTIAL/PLATELET
Basophils Absolute: 0.1 10*3/uL (ref 0.0–0.2)
Basos: 1 %
EOS (ABSOLUTE): 0.1 10*3/uL (ref 0.0–0.4)
Eos: 2 %
Hematocrit: 43.1 % (ref 34.0–46.6)
Hemoglobin: 14.5 g/dL (ref 11.1–15.9)
Immature Grans (Abs): 0 10*3/uL (ref 0.0–0.1)
Immature Granulocytes: 0 %
Lymphocytes Absolute: 1.9 10*3/uL (ref 0.7–3.1)
Lymphs: 39 %
MCH: 29.5 pg (ref 26.6–33.0)
MCHC: 33.6 g/dL (ref 31.5–35.7)
MCV: 88 fL (ref 79–97)
Monocytes Absolute: 0.4 10*3/uL (ref 0.1–0.9)
Monocytes: 7 %
Neutrophils Absolute: 2.5 10*3/uL (ref 1.4–7.0)
Neutrophils: 51 %
Platelets: 215 10*3/uL (ref 150–450)
RBC: 4.92 x10E6/uL (ref 3.77–5.28)
RDW: 14 % (ref 11.7–15.4)
WBC: 4.9 10*3/uL (ref 3.4–10.8)

## 2020-09-10 LAB — COMPREHENSIVE METABOLIC PANEL
ALT: 29 IU/L (ref 0–32)
AST: 19 IU/L (ref 0–40)
Albumin/Globulin Ratio: 2.5 — ABNORMAL HIGH (ref 1.2–2.2)
Albumin: 4.9 g/dL (ref 3.8–4.9)
Alkaline Phosphatase: 69 IU/L (ref 44–121)
BUN/Creatinine Ratio: 19 (ref 9–23)
BUN: 13 mg/dL (ref 6–24)
Bilirubin Total: 0.6 mg/dL (ref 0.0–1.2)
CO2: 21 mmol/L (ref 20–29)
Calcium: 9.8 mg/dL (ref 8.7–10.2)
Chloride: 99 mmol/L (ref 96–106)
Creatinine, Ser: 0.67 mg/dL (ref 0.57–1.00)
Globulin, Total: 2 g/dL (ref 1.5–4.5)
Glucose: 102 mg/dL — ABNORMAL HIGH (ref 65–99)
Potassium: 4 mmol/L (ref 3.5–5.2)
Sodium: 139 mmol/L (ref 134–144)
Total Protein: 6.9 g/dL (ref 6.0–8.5)
eGFR: 101 mL/min/{1.73_m2} (ref >59)

## 2020-09-10 LAB — LIPID PANEL WITH LDL/HDL RATIO
Cholesterol, Total: 193 mg/dL (ref 100–199)
HDL: 61 mg/dL (ref >39)
LDL Chol Calc (NIH): 105 mg/dL — ABNORMAL HIGH (ref 0–99)
LDL/HDL Ratio: 1.7 ratio (ref 0.0–3.2)
Triglycerides: 158 mg/dL — ABNORMAL HIGH (ref 0–149)
VLDL Cholesterol Cal: 27 mg/dL (ref 5–40)

## 2020-09-10 LAB — HEMOGLOBIN A1C
Est. average glucose Bld gHb Est-mCnc: 128 mg/dL
Hgb A1c MFr Bld: 6.1 % — ABNORMAL HIGH (ref 4.8–5.6)

## 2020-09-10 LAB — INSULIN, RANDOM: INSULIN: 12 u[IU]/mL (ref 2.6–24.9)

## 2020-09-10 LAB — VITAMIN B12: Vitamin B-12: 471 pg/mL (ref 232–1245)

## 2020-09-10 LAB — T4: T4, Total: 5.7 ug/dL (ref 4.5–12.0)

## 2020-09-10 LAB — TSH: TSH: 1.06 u[IU]/mL (ref 0.450–4.500)

## 2020-09-10 LAB — FOLATE: Folate: >20 ng/mL (ref >3.0)

## 2020-09-10 LAB — VITAMIN D 25 HYDROXY (VIT D DEFICIENCY, FRACTURES): Vit D, 25-Hydroxy: 55.8 ng/mL (ref 30.0–100.0)

## 2020-09-10 LAB — T3: T3, Total: 102 ng/dL (ref 71–180)

## 2020-09-15 DIAGNOSIS — H5213 Myopia, bilateral: Secondary | ICD-10-CM | POA: Diagnosis not present

## 2020-09-15 NOTE — Progress Notes (Signed)
Chief Complaint:   OBESITY Hannah Brady (MR# [id number]) is a 60 y.o. female who presents for evaluation and treatment of obesity and related comorbidities. Current BMI is Body mass index is 31.58 kg/m. Hannah Brady has been struggling with her weight for many years and has been unsuccessful in either losing weight, maintaining weight loss, or reaching her healthy weight goal.  Hannah Brady is currently in the action stage of change and ready to dedicate time achieving and maintaining a healthier weight. Hannah Brady is interested in becoming our patient and working on intensive lifestyle modifications including (but not limited to) diet and exercise for weight loss.  Hannah Brady's habits were reviewed today and are as follows: Her family eats meals together, she thinks her family will eat healthier with her, her desired weight loss is 9-14 lbs, she has been heavy most of her life, she started gaining weight in 2020, her heaviest weight ever was 195 pounds, she has significant food cravings issues, she snacks frequently in the evenings, she skips meals frequently, she is frequently drinking liquids with calories, she frequently makes poor food choices, she frequently eats larger portions than normal and she struggles with emotional eating.  Depression Screen Hannah Brady's Food and Mood (modified PHQ-9) score was 11.  Depression screen PHQ 2/9 09/09/2020  Decreased Interest 1  Down, Depressed, Hopeless 1  PHQ - 2 Score 2  Altered sleeping 2  Tired, decreased energy 1  Change in appetite 1  Feeling bad or failure about yourself  2  Trouble concentrating 2  Moving slowly or fidgety/restless 1  Suicidal thoughts 0  PHQ-9 Score 11  Difficult doing work/chores Not difficult at all   Subjective:   1. Other fatigue Hannah Brady admits to daytime somnolence and admits to waking up still tired. Patent has a history of symptoms of daytime fatigue. Hannah Brady generally gets 6 hours of sleep per  night, and states that she has generally restful sleep. Snoring is present. Apneic episodes are not present. Epworth Sleepiness Score is 6.  2. Hyperlipidemia, unspecified hyperlipidemia type [name] is on Lipitor 20 mg, and she has a history of TIA. She is working on weight loss.  3. Vitamin D deficiency Hannah Brady is on OTC Vit D. She is due for labs, and she still notes fatigue.  4. Hyperglycemia Hannah Brady has a history of some elevated glucose readings. She has no recent labs in [place].  5. At risk for heart disease Hannah Brady is at a higher than average risk for cardiovascular disease due to obesity.   Assessment/Plan:   1. Other fatigue Hannah Brady does feel that her weight is causing her energy to be lower than it should be. Fatigue may be related to obesity, depression or many other causes. Labs will be ordered, and in the meanwhile, Valor will focus on self care including making healthy food choices, increasing physical activity and focusing on stress reduction.  - Vitamin B12 - CBC with Differential/Platelet - EKG 12-Lead - Folate - T3 - T4 - TSH  2. Hyperlipidemia, unspecified hyperlipidemia type Cardiovascular risk and specific lipid/LDL goals reviewed.  We discussed several lifestyle modifications today. We will check labs today. Alitza will start her Category 2 plan, and will work on exercise and weight loss efforts. Orders and follow up as documented in patient record.   - Lipid Panel With LDL/HDL Ratio  3. Vitamin D deficiency Low Vitamin D level contributes to fatigue and are associated with obesity, breast, and colon cancer. We will check labs today. Hannah Brady  will follow-up for routine testing of Vitamin D, at least 2-3 times per year to avoid over-replacement.  - VITAMIN D 25 Hydroxy (Vit-D Deficiency, Fractures)  4. Hyperglycemia Fasting labs will be obtained and results with be discussed with Hannah Brady in 2 weeks at her follow up visit. In the meanwhile  Hannah Brady will start her Category 2 plan and will work on weight loss efforts.  - Comprehensive metabolic panel - Hemoglobin A1c - Insulin, random  5. Screening for depression Hannah Brady had a positive depression screening. Depression is commonly associated with obesity and often results in emotional eating behaviors. We will monitor this closely and work on CBT to help improve the non-hunger eating patterns. Referral to Psychology may be required if no improvement is seen as she continues in our clinic.  6. At risk for heart disease Hannah Brady was given approximately 30 minutes of coronary artery disease prevention counseling today. She is 60 y.o. female and has risk factors for heart disease including obesity. We discussed intensive lifestyle modifications today with an emphasis on specific weight loss instructions and strategies.   Repetitive spaced learning was employed today to elicit superior memory formation and behavioral change.  7. Class 1 obesity with serious comorbidity and body mass index (BMI) of 31.0 to 31.9 in adult, unspecified obesity type Hannah Brady is currently in the action stage of change and her goal is to continue with weight loss efforts. I recommend Hannah Brady begin the structured treatment plan as follows:  She has agreed to the Category 2 Plan + 100 calories.  Exercise goals: No exercise has been prescribed for now, while we concentrate on nutritional changes.  Behavioral modification strategies: increasing lean protein intake, decreasing eating out and no skipping meals.  She was informed of the importance of frequent follow-up visits to maximize her success with intensive lifestyle modifications for her multiple health conditions. She was informed we would discuss her lab results at her next visit unless there is a critical issue that needs to be addressed sooner. Hannah Brady agreed to keep her next visit at the agreed upon time to discuss these results.  Objective:    Blood pressure 131/71, pulse 64, temperature 98 F (36.7 C), height 5\' 4"  (1.626 m), weight 184 lb (83.5 kg), SpO2 98 %. Body mass index is 31.58 kg/m.  EKG: Normal sinus rhythm, rate 72 BPM.  Indirect Calorimeter completed today shows a VO2 of 260 and a REE of 1811.  Her calculated basal metabolic rate is 0160 thus her basal metabolic rate is better than expected.  General: Cooperative, alert, well developed, in no acute distress. HEENT: Conjunctivae and lids unremarkable. Cardiovascular: Regular rhythm.  Lungs: Normal work of breathing. Neurologic: No focal deficits.   Lab Results  Component Value Date   CREATININE 0.67 09/09/2020   BUN 13 09/09/2020   NA 139 09/09/2020   K 4.0 09/09/2020   CL 99 09/09/2020   CO2 21 09/09/2020   Lab Results  Component Value Date   ALT 29 09/09/2020   AST 19 09/09/2020   ALKPHOS 69 09/09/2020   BILITOT 0.6 09/09/2020   Lab Results  Component Value Date   HGBA1C 6.1 (H) 09/09/2020   HGBA1C 5.7 (H) 06/16/2015   Lab Results  Component Value Date   INSULIN 12.0 09/09/2020   Lab Results  Component Value Date   TSH 1.060 09/09/2020   Lab Results  Component Value Date   CHOL 193 09/09/2020   HDL 61 09/09/2020   LDLCALC 105 (H) 09/09/2020  TRIG 158 (H) 09/09/2020   CHOLHDL 5.6 06/16/2015   Lab Results  Component Value Date   WBC 4.9 09/09/2020   HGB 14.5 09/09/2020   HCT 43.1 09/09/2020   MCV 88 09/09/2020   PLT 215 09/09/2020   No results found for: IRON, TIBC, FERRITIN  Attestation Statements:   Reviewed by clinician on day of visit: allergies, medications, problem list, medical history, surgical history, family history, social history, and previous encounter notes.   I, Trixie Dredge, am acting as transcriptionist for Dennard Nip, MD.  I have reviewed the above documentation for accuracy and completeness, and I agree with the above. - Dennard Nip, MD

## 2020-09-18 ENCOUNTER — Other Ambulatory Visit (HOSPITAL_COMMUNITY): Payer: Self-pay

## 2020-09-21 DIAGNOSIS — Z719 Counseling, unspecified: Secondary | ICD-10-CM

## 2020-09-23 ENCOUNTER — Other Ambulatory Visit: Payer: Self-pay

## 2020-09-23 ENCOUNTER — Other Ambulatory Visit (HOSPITAL_COMMUNITY): Payer: Self-pay

## 2020-09-23 ENCOUNTER — Ambulatory Visit (INDEPENDENT_AMBULATORY_CARE_PROVIDER_SITE_OTHER): Payer: 59 | Admitting: Family Medicine

## 2020-09-23 ENCOUNTER — Encounter (INDEPENDENT_AMBULATORY_CARE_PROVIDER_SITE_OTHER): Payer: Self-pay | Admitting: Family Medicine

## 2020-09-23 VITALS — BP 125/73 | HR 66 | Temp 98.5°F | Ht 64.0 in | Wt 179.0 lb

## 2020-09-23 DIAGNOSIS — R7303 Prediabetes: Secondary | ICD-10-CM | POA: Diagnosis not present

## 2020-09-23 DIAGNOSIS — E669 Obesity, unspecified: Secondary | ICD-10-CM | POA: Diagnosis not present

## 2020-09-23 DIAGNOSIS — E785 Hyperlipidemia, unspecified: Secondary | ICD-10-CM

## 2020-09-23 DIAGNOSIS — Z6831 Body mass index (BMI) 31.0-31.9, adult: Secondary | ICD-10-CM | POA: Diagnosis not present

## 2020-09-23 DIAGNOSIS — E559 Vitamin D deficiency, unspecified: Secondary | ICD-10-CM | POA: Diagnosis not present

## 2020-09-24 ENCOUNTER — Other Ambulatory Visit (HOSPITAL_COMMUNITY): Payer: Self-pay

## 2020-09-24 MED ORDER — ATORVASTATIN CALCIUM 40 MG PO TABS
40.0000 mg | ORAL_TABLET | Freq: Every day | ORAL | 3 refills | Status: DC
  Filled 2020-09-24: qty 90, 90d supply, fill #0

## 2020-09-24 NOTE — Progress Notes (Signed)
Chief Complaint:   OBESITY Hannah Brady is here to discuss her progress with her obesity treatment plan along with follow-up of her obesity related diagnoses. Hannah Brady is on the Category 2 Plan + 100 calories and states she is following her eating plan approximately 20% of the time. Hannah Brady states she is at the gym for 20 minutes 4 times per week.  Today's visit was #: 2 Starting weight: 184 lbs Starting date: 09/09/2020 Today's weight: 179 lbs Today's date: 09/23/2020 Total lbs lost to date: 5 Total lbs lost since last in-office visit: 5  Interim History: Hannah Brady has done well with weight loss. She wasn't able to follow the plan as closely some days, but she was still mindful of her food choices.  Subjective:   1. Hyperlipidemia, unspecified hyperlipidemia type Hannah Brady's LDL is above goal. She had been on a lower dose of a statin, but increased her Lipitor back up to 40 mg. I discussed labs with the patient today.  2. Vitamin D deficiency Hannah Brady is on OTC Vit D 5,000 IU daily, and her level is at goal. I discussed labs with the patient today.  3. Pre-diabetes Hannah Brady has a new diagnosis of pre-diabetes. Her fasting glucose and A1c are elevated as well as her fasting insulin. She notes polyphagia, but this improved on her higher protein, low fat, low simple carbohydrate diet. I discussed labs with the patient today.  Assessment/Plan:   1. Hyperlipidemia, unspecified hyperlipidemia type Cardiovascular risk and specific lipid/LDL goals reviewed. We discussed several lifestyle modifications today. Hannah Brady will continue Lipitor 40 mg and will continue diet, exercise and, weight loss efforts. Will follow closely. Orders and follow up as documented in patient record.   2. Vitamin D deficiency Low Vitamin D level contributes to fatigue and are associated with obesity, breast, and colon cancer. Hannah Brady agreed to continue taking OTC Vitamin D 5,000 IU daily and will follow-up  for routine testing of Vitamin D, at least 2-3 times per year to avoid over-replacement.  3. Pre-diabetes Hannah Brady defers metformin, and she will continue to work on diet, exercise, and decreasing simple carbohydrates to help decrease the risk of diabetes.   4. Obesity with current BMI30.8 Hannah Brady is currently in the action stage of change. As such, her goal is to continue with weight loss efforts. She has agreed to the Category 2 Plan and keeping a food journal and adhering to recommended goals of 400-550 calories and 40+ grams of protein at supper daily.   Exercise goals: As is.  Behavioral modification strategies: increasing lean protein intake and meal planning and cooking strategies.  Hannah Brady has agreed to follow-up with our clinic in 2 weeks. She was informed of the importance of frequent follow-up visits to maximize her success with intensive lifestyle modifications for her multiple health conditions.   Objective:   Blood pressure 125/73, pulse 66, temperature 98.5 F (36.9 C), height 5\' 4"  (1.626 m), weight 179 lb (81.2 kg), SpO2 (!) 71 %. Body mass index is 30.73 kg/m.  General: Cooperative, alert, well developed, in no acute distress. HEENT: Conjunctivae and lids unremarkable. Cardiovascular: Regular rhythm.  Lungs: Normal work of breathing. Neurologic: No focal deficits.   Lab Results  Component Value Date   CREATININE 0.67 09/09/2020   BUN 13 09/09/2020   NA 139 09/09/2020   K 4.0 09/09/2020   CL 99 09/09/2020   CO2 21 09/09/2020   Lab Results  Component Value Date   ALT 29 09/09/2020   AST 19 09/09/2020  ALKPHOS 69 09/09/2020   BILITOT 0.6 09/09/2020   Lab Results  Component Value Date   HGBA1C 6.1 (H) 09/09/2020   HGBA1C 5.7 (H) 06/16/2015   Lab Results  Component Value Date   INSULIN 12.0 09/09/2020   Lab Results  Component Value Date   TSH 1.060 09/09/2020   Lab Results  Component Value Date   CHOL 193 09/09/2020   HDL 61 09/09/2020    LDLCALC 105 (H) 09/09/2020   TRIG 158 (H) 09/09/2020   CHOLHDL 5.6 06/16/2015   Lab Results  Component Value Date   WBC 4.9 09/09/2020   HGB 14.5 09/09/2020   HCT 43.1 09/09/2020   MCV 88 09/09/2020   PLT 215 09/09/2020   No results found for: IRON, TIBC, FERRITIN  Attestation Statements:   Reviewed by clinician on day of visit: allergies, medications, problem list, medical history, surgical history, family history, social history, and previous encounter notes.  Time spent on visit including pre-visit chart review and post-visit care and charting was 45 minutes.    I, Trixie Dredge, am acting as transcriptionist for Dennard Nip, MD.  I have reviewed the above documentation for accuracy and completeness, and I agree with the above. -  Dennard Nip, MD

## 2020-10-07 ENCOUNTER — Ambulatory Visit (INDEPENDENT_AMBULATORY_CARE_PROVIDER_SITE_OTHER): Payer: 59 | Admitting: Family Medicine

## 2020-10-07 ENCOUNTER — Other Ambulatory Visit: Payer: Self-pay

## 2020-10-07 ENCOUNTER — Encounter (INDEPENDENT_AMBULATORY_CARE_PROVIDER_SITE_OTHER): Payer: Self-pay | Admitting: Family Medicine

## 2020-10-07 VITALS — BP 109/62 | HR 61 | Temp 97.9°F | Ht 64.0 in | Wt 176.0 lb

## 2020-10-07 DIAGNOSIS — Z6831 Body mass index (BMI) 31.0-31.9, adult: Secondary | ICD-10-CM | POA: Diagnosis not present

## 2020-10-07 DIAGNOSIS — E7849 Other hyperlipidemia: Secondary | ICD-10-CM

## 2020-10-07 DIAGNOSIS — E669 Obesity, unspecified: Secondary | ICD-10-CM

## 2020-10-13 NOTE — Progress Notes (Signed)
Chief Complaint:   OBESITY Hannah Brady is here to discuss her progress with her obesity treatment plan along with follow-up of her obesity related diagnoses.   Today's visit was #: 3 Starting weight: 184 lbs Starting date: 09/09/2020 Today's weight: 176 lbs Today's date: 10/07/2020 Weight change since last visit: 3 lbs Total lbs lost to date: 8 lbs Body mass index is 30.21 kg/m.  Total weight loss percentage to date: -4.35%  Interim History:  Hannah Brady is here for a follow up office visit and she is following the meal plan without concerns or issues.  Patient's meal and food recall appears to be accurate and consistent with what is on the plan.  When on plan, her hunger and cravings are well controlled.    This is Hannah Brady's first office visit with me.  Hannah Brady says that Saturday is her "splurge day".  She wishes she did not splurge, though, as she is disappointed with her weight loss today.  She is not really journaling at night and still struggles with recipe ideas.  Plan:  Long discussion with her regarding food prep, recipe ideas, and how to search online for weight loss recipe ideas, etc.  Current Meal Plan: the Category 2 Plan and keeping a food journal and adhering to recommended goals of 400-550 calories and 40+ grams of protein at supper for 90% of the time.  Current Exercise Plan: Elliptical and stretching for 20 minutes 4-5 times per week.  Assessment/Plan:    1. Other hyperlipidemia Course: Not at goal. Lipid-lowering medications: Lipitor 40 mg daily, per PCP.  Plan:  Continue Lipitor.  Continue prudent nutritional plan, decrease saturated fat, continue weight loss and increase exercise eventually.  Dietary changes: Increase soluble fiber, decrease simple carbohydrates, decrease saturated fat. Exercise changes: Moderate to vigorous-intensity aerobic activity 150 minutes per week or as tolerated. We will continue to monitor along with PCP/specialists as  it pertains to her weight loss journey.  Lab Results  Component Value Date   CHOL 193 09/09/2020   HDL 61 09/09/2020   LDLCALC 105 (H) 09/09/2020   TRIG 158 (H) 09/09/2020   CHOLHDL 5.6 06/16/2015   Lab Results  Component Value Date   ALT 29 09/09/2020   AST 19 09/09/2020   ALKPHOS 69 09/09/2020   BILITOT 0.6 09/09/2020   2. Obesity, current BMI 30.3  Course: Hannah Brady is currently in the action stage of change. As such, her goal is to continue with weight loss efforts.   Nutrition goals: She has agreed to the Category 2 Plan and keeping a food journal and adhering to recommended goals of 400-550 calories and 40+ grams of protein at supper.   Exercise goals: As is.  Behavioral modification strategies: meal planning and cooking strategies and better snacking choices.  Hannah Brady has agreed to follow-up with our clinic in 2 weeks. She was informed of the importance of frequent follow-up visits to maximize her success with intensive lifestyle modifications for her multiple health conditions.   Objective:   Blood pressure 109/62, pulse 61, temperature 97.9 F (36.6 C), height 5\' 4"  (1.626 m), weight 176 lb (79.8 kg), SpO2 97 %. Body mass index is 30.21 kg/m.  General: Cooperative, alert, well developed, in no acute distress. HEENT: Conjunctivae and lids unremarkable. Cardiovascular: Regular rhythm.  Lungs: Normal work of breathing. Neurologic: No focal deficits.   Lab Results  Component Value Date   CREATININE 0.67 09/09/2020   BUN 13 09/09/2020   NA 139 09/09/2020  K 4.0 09/09/2020   CL 99 09/09/2020   CO2 21 09/09/2020   Lab Results  Component Value Date   ALT 29 09/09/2020   AST 19 09/09/2020   ALKPHOS 69 09/09/2020   BILITOT 0.6 09/09/2020   Lab Results  Component Value Date   HGBA1C 6.1 (H) 09/09/2020   HGBA1C 5.7 (H) 06/16/2015   Lab Results  Component Value Date   INSULIN 12.0 09/09/2020   Lab Results  Component Value Date   TSH 1.060 09/09/2020    Lab Results  Component Value Date   CHOL 193 09/09/2020   HDL 61 09/09/2020   LDLCALC 105 (H) 09/09/2020   TRIG 158 (H) 09/09/2020   CHOLHDL 5.6 06/16/2015   Lab Results  Component Value Date   WBC 4.9 09/09/2020   HGB 14.5 09/09/2020   HCT 43.1 09/09/2020   MCV 88 09/09/2020   PLT 215 09/09/2020   Attestation Statements:   Reviewed by clinician on day of visit: allergies, medications, problem list, medical history, surgical history, family history, social history, and previous encounter notes.  Time spent on visit including pre-visit chart review and post-visit care and charting was >22 minutes.   I, Water quality scientist, CMA, am acting as Location manager for [employer], DO.  I have reviewed the above documentation for accuracy and completeness, and I agree with the above. Marjory Sneddon, D.O.  The [place] was signed into law in 2016 which includes the topic of electronic health records.  This provides immediate access to information in MyChart.  This includes consultation notes, operative notes, office notes, lab results and pathology reports.  If you have any questions about what you read please let us know at your next visit so we can discuss your concerns and take corrective action if need be.  We are right here with you.

## 2020-10-28 ENCOUNTER — Ambulatory Visit (INDEPENDENT_AMBULATORY_CARE_PROVIDER_SITE_OTHER): Payer: 59 | Admitting: Family Medicine

## 2020-11-09 ENCOUNTER — Encounter (INDEPENDENT_AMBULATORY_CARE_PROVIDER_SITE_OTHER): Payer: Self-pay | Admitting: Family Medicine

## 2020-11-09 ENCOUNTER — Ambulatory Visit (INDEPENDENT_AMBULATORY_CARE_PROVIDER_SITE_OTHER): Payer: 59 | Admitting: Family Medicine

## 2020-11-09 ENCOUNTER — Other Ambulatory Visit: Payer: Self-pay

## 2020-11-09 VITALS — BP 123/69 | HR 77 | Temp 98.2°F | Ht 64.0 in | Wt 173.0 lb

## 2020-11-09 DIAGNOSIS — E559 Vitamin D deficiency, unspecified: Secondary | ICD-10-CM

## 2020-11-09 DIAGNOSIS — E669 Obesity, unspecified: Secondary | ICD-10-CM

## 2020-11-09 DIAGNOSIS — Z6831 Body mass index (BMI) 31.0-31.9, adult: Secondary | ICD-10-CM

## 2020-11-09 DIAGNOSIS — R7303 Prediabetes: Secondary | ICD-10-CM

## 2020-11-16 NOTE — Progress Notes (Signed)
Chief Complaint:   OBESITY Hannah Brady is here to discuss her progress with her obesity treatment plan along with follow-up of her obesity related diagnoses.   Today's visit was #: 4 Starting weight: 184 lbs Starting date: 09/09/2020 Today's weight: 173 lbs Today's date: 11/09/2020 Weight change since last visit: 3 lbs Total lbs lost to date: 11 lbs Body mass index is 29.7 kg/m.  Total weight loss percentage to date: -5.98%  Interim History:  [name] lost her father 2 weeks ago.  She says it has been more difficult to stick to plan.  She did a lot of traveling.  Her last office visit was 1 month ago.  She wants to discuss travel eating strategies.  Current Meal Plan: the Category 2 Plan and keeping a food journal and adhering to recommended goals of 400-550 calories and 40+ grams of protein for supper 60% of the time.  Current Exercise Plan: Cardio for 25 minutes 4-5 times per week.  Assessment/Plan:   Medications Discontinued During This Encounter  Medication Reason   atorvastatin (LIPITOR) 40 MG tablet Error    1. Prediabetes Not at goal. Goal is HgbA1c < 5.7.  Medication: None.   Denies cravings and hunger.  Plan:  Discussed various medications to aid with weight loss.  She prefers to hold off for now and only use medications if weight loss efforts stall.  She will continue to focus on protein-rich, low simple carbohydrate foods. We reviewed the importance of hydration, regular exercise for stress reduction, and restorative sleep.  Continue prudent nutritional plan and exercise.  Lab Results  Component Value Date   HGBA1C 6.1 (H) 09/09/2020   Lab Results  Component Value Date   INSULIN 12.0 09/09/2020   2. Vitamin D deficiency At goal. Current vitamin D is 55.8, tested on 09/09/2020. Optimal goal > 50 ng/dL.  She takes OTC vitamin D 5,000 IU daily.  Tolerating well, without side effects.  Plan: Continue current OTC vitamin D supplementation.  Follow-up for routine  testing of Vitamin D, at least 2-3 times per year to avoid over-replacement.  Will monitor.  3. Class 1 obesity with serious comorbidity and body mass index (BMI) of 31.0 to 31.9 in adult, unspecified obesity type  Course: [name] is currently in the action stage of change. As such, her goal is to continue with weight loss efforts.   Nutrition goals: She has agreed to the Category 2 Plan and keeping a food journal and adhering to recommended goals of 400-550 calories and 40 grams of protein at supper.   Exercise goals:  As is.  Behavioral modification strategies: travel eating strategies and celebration eating strategies.  [name] has agreed to follow-up with our clinic in 2-3 weeks. She was informed of the importance of frequent follow-up visits to maximize her success with intensive lifestyle modifications for her multiple health conditions.   Objective:   Blood pressure 123/69, pulse 77, temperature 98.2 F (36.8 C), height 5\' 4"  (1.626 m), weight 173 lb (78.5 kg), SpO2 97 %. Body mass index is 29.7 kg/m.  General: Cooperative, alert, well developed, in no acute distress. HEENT: Conjunctivae and lids unremarkable. Cardiovascular: Regular rhythm.  Lungs: Normal work of breathing. Neurologic: No focal deficits.   Lab Results  Component Value Date   CREATININE 0.67 09/09/2020   BUN 13 09/09/2020   NA 139 09/09/2020   K 4.0 09/09/2020   CL 99 09/09/2020   CO2 21 09/09/2020   Lab Results  Component Value Date  ALT 29 09/09/2020   AST 19 09/09/2020   ALKPHOS 69 09/09/2020   BILITOT 0.6 09/09/2020   Lab Results  Component Value Date   HGBA1C 6.1 (H) 09/09/2020   HGBA1C 5.7 (H) 06/16/2015   Lab Results  Component Value Date   INSULIN 12.0 09/09/2020   Lab Results  Component Value Date   TSH 1.060 09/09/2020   Lab Results  Component Value Date   CHOL 193 09/09/2020   HDL 61 09/09/2020   LDLCALC 105 (H) 09/09/2020   TRIG 158 (H) 09/09/2020   CHOLHDL 5.6  06/16/2015   Lab Results  Component Value Date   WBC 4.9 09/09/2020   HGB 14.5 09/09/2020   HCT 43.1 09/09/2020   MCV 88 09/09/2020   PLT 215 09/09/2020   Attestation Statements:   Reviewed by clinician on day of visit: allergies, medications, problem list, medical history, surgical history, family history, social history, and previous encounter notes.  Time spent on visit including pre-visit chart review and post-visit care and charting was 20 minutes.   I, Water quality scientist, CMA, am acting as Location manager for [employer], DO.  I have reviewed the above documentation for accuracy and completeness, and I agree with the above. Marjory Sneddon, D.O.  The [place] was signed into law in 2016 which includes the topic of electronic health records.  This provides immediate access to information in MyChart.  This includes consultation notes, operative notes, office notes, lab results and pathology reports.  If you have any questions about what you read please let us know at your next visit so we can discuss your concerns and take corrective action if need be.  We are right here with you.

## 2020-11-30 ENCOUNTER — Ambulatory Visit (INDEPENDENT_AMBULATORY_CARE_PROVIDER_SITE_OTHER): Payer: 59 | Admitting: Family Medicine

## 2020-11-30 ENCOUNTER — Other Ambulatory Visit: Payer: Self-pay

## 2020-11-30 VITALS — BP 117/71 | HR 68 | Temp 98.1°F | Ht 64.0 in | Wt 174.0 lb

## 2020-11-30 DIAGNOSIS — R7303 Prediabetes: Secondary | ICD-10-CM

## 2020-11-30 DIAGNOSIS — E7849 Other hyperlipidemia: Secondary | ICD-10-CM | POA: Diagnosis not present

## 2020-11-30 DIAGNOSIS — E669 Obesity, unspecified: Secondary | ICD-10-CM | POA: Diagnosis not present

## 2020-11-30 DIAGNOSIS — Z6831 Body mass index (BMI) 31.0-31.9, adult: Secondary | ICD-10-CM | POA: Diagnosis not present

## 2020-11-30 DIAGNOSIS — Z9189 Other specified personal risk factors, not elsewhere classified: Secondary | ICD-10-CM | POA: Diagnosis not present

## 2020-12-10 NOTE — Progress Notes (Signed)
Chief Complaint:   OBESITY Hannah Brady is here to discuss her progress with her obesity treatment plan along with follow-up of her obesity related diagnoses.   Today's visit was #: 5 Starting weight: 184 lbs Starting date: 09/09/2020 Today's weight: 174 lbs Today's date: 11/30/2020 Weight change since last visit: +1 lb Total lbs lost to date: 10 lbs Body mass index is 29.87 kg/m.  Total weight loss percentage to date: -5.43%  Interim History:  Hannah Brady had 4th of July celebration eating over the entire weekend.  She is not surprised that she gained.  Current Meal Plan: the Category 2 Plan and keeping a food journal and adhering to recommended goals of 400-550 calories and 40 grams of protein at supper for 80% of the time.  Current Exercise Plan: Glider for 20 minutes 4 times per week.  Assessment/Plan:   Orders Placed This Encounter  Procedures   Hemoglobin A1c   Lipid Panel With LDL/HDL Ratio   ALT   1. Other hyperlipidemia Course: Not at goal. Lipid-lowering medications: Lipitor 40 mg daily.  Hannah Brady stated taking Lipitor regularly around 09/23/2020.  Tolerating well, without side effects.  Drinks a lot of water.  Plan: Dietary changes: Increase soluble fiber, decrease simple carbohydrates, decrease saturated fat. Exercise changes: Moderate to vigorous-intensity aerobic activity 150 minutes per week or as tolerated. We will continue to monitor along with PCP/specialists as it pertains to her weight loss journey.  Not at goal.  At next office visit, come fasting to recheck FLP since she has been on Lipitor regularly for 2.5 months.  Continue prudent nutritional plan.  Lab Results  Component Value Date   CHOL 193 09/09/2020   HDL 61 09/09/2020   LDLCALC 105 (H) 09/09/2020   TRIG 158 (H) 09/09/2020   CHOLHDL 5.6 06/16/2015   Lab Results  Component Value Date   ALT 29 09/09/2020   AST 19 09/09/2020   ALKPHOS 69 09/09/2020   BILITOT 0.6 09/09/2020   - Lipid Panel  With LDL/HDL Ratio - ALT  2. Prediabetes Not at goal. Goal is HgbA1c < 5.7.  Medication: None.  She endorses some carb cravings.  She habitually eats chocolate as well.  Plan:  She will continue to focus on protein-rich, low simple carbohydrate foods. We reviewed the importance of hydration, regular exercise for stress reduction, and restorative sleep.  Recheck A1c at next office visit.  She declines medications.  Desires prudent nutritional plan and exercise at this time.  Continue to monitor closely.   Lab Results  Component Value Date   HGBA1C 6.1 (H) 09/09/2020   Lab Results  Component Value Date   INSULIN 12.0 09/09/2020   - Hemoglobin A1c  3. At risk for dehydration Hannah Brady is at higher than average risk of dehydration.  She is drinking 60 ounces of water per day on average.  Goal is 90 ounces per day.  Hannah Brady was given more than 8 minutes of proper hydration counseling today.  We discussed the signs and symptoms of dehydration, some of which may include muscle cramping, constipation or even orthostatic symptoms.  Counseling on the prevention of dehydration was also provided today.  Hannah Brady is at risk for dehydration due to weight loss, lifestyle and behavorial habits and possibly due to taking certain medication(s).  She was encouraged to adequately hydrate and monitor fluid status to avoid dehydration as well as weight loss plateaus.  Unless pre-existing renal or cardiopulmonary conditions exist, in which patient was told to limit their fluid  intake, I recommended roughly one half of their weight in pounds to be the approximate ounces of non-caloric, non-caffeinated beverages they should drink per day; including more if they are engaging in exercise.   4. Class 1 obesity with serious comorbidity and body mass index (BMI) of 31.0 to 31.9 in adult, unspecified obesity type  Course: Hannah Brady is currently in the action stage of change. As such, her goal is to continue with weight  loss efforts.   Nutrition goals: She has agreed to the Category 2 Plan and keeping a food journal and adhering to recommended goals of 400-550 calories and 40 grams of protein at supper.   Exercise goals: For substantial health benefits, adults should do at least 150 minutes (2 hours and 30 minutes) a week of moderate-intensity, or 75 minutes (1 hour and 15 minutes) a week of vigorous-intensity aerobic physical activity, or an equivalent combination of moderate- and vigorous-intensity aerobic activity. Aerobic activity should be performed in episodes of at least 10 minutes, and preferably, it should be spread throughout the week.  Behavioral modification strategies: holiday eating strategies  and celebration eating strategies.  Hannah Brady has agreed to follow-up with our clinic in 3 weeks, fasting. She was informed of the importance of frequent follow-up visits to maximize her success with intensive lifestyle modifications for her multiple health conditions.   Objective:   Blood pressure 117/71, pulse 68, temperature 98.1 F (36.7 C), height '5\' 4"'$  (1.626 m), weight 174 lb (78.9 kg), SpO2 98 %. Body mass index is 29.87 kg/m.  General: Cooperative, alert, well developed, in no acute distress. HEENT: Conjunctivae and lids unremarkable. Cardiovascular: Regular rhythm.  Lungs: Normal work of breathing. Neurologic: No focal deficits.   Lab Results  Component Value Date   CREATININE 0.67 09/09/2020   BUN 13 09/09/2020   NA 139 09/09/2020   K 4.0 09/09/2020   CL 99 09/09/2020   CO2 21 09/09/2020   Lab Results  Component Value Date   ALT 29 09/09/2020   AST 19 09/09/2020   ALKPHOS 69 09/09/2020   BILITOT 0.6 09/09/2020   Lab Results  Component Value Date   HGBA1C 6.1 (H) 09/09/2020   HGBA1C 5.7 (H) 06/16/2015   Lab Results  Component Value Date   INSULIN 12.0 09/09/2020   Lab Results  Component Value Date   TSH 1.060 09/09/2020   Lab Results  Component Value Date   CHOL  193 09/09/2020   HDL 61 09/09/2020   LDLCALC 105 (H) 09/09/2020   TRIG 158 (H) 09/09/2020   CHOLHDL 5.6 06/16/2015   Lab Results  Component Value Date   VD25OH 55.8 09/09/2020   Lab Results  Component Value Date   WBC 4.9 09/09/2020   HGB 14.5 09/09/2020   HCT 43.1 09/09/2020   MCV 88 09/09/2020   PLT 215 09/09/2020   Attestation Statements:   Reviewed by clinician on day of visit: allergies, medications, problem list, medical history, surgical history, family history, social history, and previous encounter notes.   I Water quality scientist am acting as Location manager for Dr. Raliegh Scarlet.  I have reviewed the above documentation for accuracy and completeness, and I agree with the above. Hannah Brady, D.O.  The [place] was signed into law in 2016 which includes the topic of electronic health records.  This provides immediate access to information in MyChart.  This includes consultation notes, operative notes, office notes, lab results and pathology reports.  If you have any questions about what  you read please let us know at your next visit so we can discuss your concerns and take corrective action if need be.  We are right here with you.

## 2020-12-17 ENCOUNTER — Encounter (INDEPENDENT_AMBULATORY_CARE_PROVIDER_SITE_OTHER): Payer: Self-pay

## 2020-12-21 ENCOUNTER — Encounter (INDEPENDENT_AMBULATORY_CARE_PROVIDER_SITE_OTHER): Payer: Self-pay | Admitting: Family Medicine

## 2020-12-21 ENCOUNTER — Ambulatory Visit (INDEPENDENT_AMBULATORY_CARE_PROVIDER_SITE_OTHER): Payer: 59 | Admitting: Family Medicine

## 2020-12-21 ENCOUNTER — Other Ambulatory Visit: Payer: Self-pay

## 2020-12-21 ENCOUNTER — Other Ambulatory Visit (HOSPITAL_COMMUNITY): Payer: Self-pay

## 2020-12-21 VITALS — BP 132/73 | HR 60 | Temp 97.9°F | Ht 64.0 in | Wt 176.0 lb

## 2020-12-21 DIAGNOSIS — Z6831 Body mass index (BMI) 31.0-31.9, adult: Secondary | ICD-10-CM | POA: Diagnosis not present

## 2020-12-21 DIAGNOSIS — R7303 Prediabetes: Secondary | ICD-10-CM

## 2020-12-21 DIAGNOSIS — E7849 Other hyperlipidemia: Secondary | ICD-10-CM | POA: Diagnosis not present

## 2020-12-21 DIAGNOSIS — Z9189 Other specified personal risk factors, not elsewhere classified: Secondary | ICD-10-CM | POA: Diagnosis not present

## 2020-12-21 DIAGNOSIS — E669 Obesity, unspecified: Secondary | ICD-10-CM | POA: Diagnosis not present

## 2020-12-21 MED ORDER — ATORVASTATIN CALCIUM 40 MG PO TABS
40.0000 mg | ORAL_TABLET | Freq: Every day | ORAL | 0 refills | Status: DC
  Filled 2020-12-21: qty 90, 90d supply, fill #0

## 2020-12-22 LAB — LIPID PANEL WITH LDL/HDL RATIO
Cholesterol, Total: 149 mg/dL (ref 100–199)
HDL: 55 mg/dL (ref >39)
LDL Chol Calc (NIH): 72 mg/dL (ref 0–99)
LDL/HDL Ratio: 1.3 ratio (ref 0.0–3.2)
Triglycerides: 124 mg/dL (ref 0–149)
VLDL Cholesterol Cal: 22 mg/dL (ref 5–40)

## 2020-12-22 LAB — ALT: ALT: 38 IU/L — ABNORMAL HIGH (ref 0–32)

## 2020-12-22 LAB — HEMOGLOBIN A1C
Est. average glucose Bld gHb Est-mCnc: 126 mg/dL
Hgb A1c MFr Bld: 6 % — ABNORMAL HIGH (ref 4.8–5.6)

## 2020-12-22 NOTE — Progress Notes (Signed)
Chief Complaint:   OBESITY Hannah Brady is here to discuss her progress with her obesity treatment plan along with follow-up of her obesity related diagnoses. Hannah Brady is on the Category 2 Plan and keeping a food journal and adhering to recommended goals of 400-550 calories and 40 grams protein at supper and states she is following her eating plan approximately 75% of the time. Hannah Brady states she is stretching and elliptical 20 minutes 5 times per week.  Today's visit was #: 6 Starting weight: 184 lbs Starting date: 09/09/2020 Today's weight: 176 lbs Today's date: 12/21/2020 Total lbs lost to date: 8 Total lbs lost since last in-office visit: +2  Interim History: Hannah Brady was on vacation for 1 week in [place], [place] recently. She ate, drank, and was [employer]. She is not surprised she gained. Her family is coming into town for 2 weeks in mid August, which will be challenging as well.   Subjective:   1. Pre-diabetes Hannah Brady has a diagnosis of prediabetes based on her elevated HgA1c and was informed this puts her at greater risk of developing diabetes. She continues to work on diet and exercise to decrease her risk of diabetes. She denies nausea or hypoglycemia.  Lab Results  Component Value Date   HGBA1C 6.0 (H) 12/21/2020   Lab Results  Component Value Date   INSULIN 12.0 09/09/2020   2. Other hyperlipidemia We increased Hannah Brady's Lipitor from 20 mg to 40 mg 6-8 weeks ago. She drinks 80-90 oz of water per day now.  Lab Results  Component Value Date   ALT 38 (H) 12/21/2020   AST 19 09/09/2020   ALKPHOS 69 09/09/2020   BILITOT 0.6 09/09/2020   Lab Results  Component Value Date   CHOL 149 12/21/2020   HDL 55 12/21/2020   LDLCALC 72 12/21/2020   TRIG 124 12/21/2020   CHOLHDL 5.6 06/16/2015   3. At risk for diabetes mellitus Hannah Brady is at higher than average risk for developing diabetes due to obesity.   Assessment/Plan:  No orders of the defined types were placed  in this encounter.   Medications Discontinued During This Encounter  Medication Reason   Medium Chain Triglycerides (MCT OIL PO) Error   atorvastatin (LIPITOR) 40 MG tablet Reorder     Meds ordered this encounter  Medications   atorvastatin (LIPITOR) 40 MG tablet    Sig: Take 1 tablet (40 mg total) by mouth at bedtime.    Dispense:  90 tablet    Refill:  0     1. Pre-diabetes I discussed Ozempic with pt and she wishes to hold off on meds for now (declines Metformin also).  2. Other hyperlipidemia Cardiovascular risk and specific lipid/LDL goals reviewed.  We discussed several lifestyle modifications today and Hannah Brady will continue to work on diet, exercise and weight loss efforts. Orders and follow up as documented in patient record.   Counseling Intensive lifestyle modifications are the first line treatment for this issue. Dietary changes: Increase soluble fiber. Decrease simple carbohydrates. Exercise changes: Moderate to vigorous-intensity aerobic activity 150 minutes per week if tolerated. Lipid-lowering medications: see documented in medical record.  Refill (90 day supply requested)- atorvastatin (LIPITOR) 40 MG tablet; Take 1 tablet (40 mg total) by mouth at bedtime.  Dispense: 90 tablet; Refill: 0  3. At risk for diabetes mellitus Hannah Brady was given approximately 9 minutes of diabetes education and counseling today. We discussed intensive lifestyle modifications today with an emphasis on weight loss as well as increasing exercise and decreasing  simple carbohydrates in her diet. We also reviewed medication options with an emphasis on risk versus benefit of those discussed.   Repetitive spaced learning was employed today to elicit superior memory formation and behavioral change.  4. Obesity with current BMI of 30.3  Hannah Brady is currently in the action stage of change. As such, her goal is to continue with weight loss efforts. She has agreed to the Category 2 Plan and  keeping a food journal and adhering to recommended goals of 400-550 calories and 40 grams protein at supper.   Exercise goals: For substantial health benefits, adults should do at least 150 minutes (2 hours and 30 minutes) a week of moderate-intensity, or 75 minutes (1 hour and 15 minutes) a week of vigorous-intensity aerobic physical activity, or an equivalent combination of moderate- and vigorous-intensity aerobic activity. Aerobic activity should be performed in episodes of at least 10 minutes, and preferably, it should be spread throughout the week.  Behavioral modification strategies: increasing lean protein intake, decreasing simple carbohydrates, travel eating strategies, and holiday eating strategies .  Hannah Brady has agreed to follow-up with our clinic in 3 weeks. She was informed of the importance of frequent follow-up visits to maximize her success with intensive lifestyle modifications for her multiple health conditions.   Objective:   Blood pressure 132/73, pulse 60, temperature 97.9 F (36.6 C), height '5\' 4"'$  (1.626 m), weight 176 lb (79.8 kg), SpO2 99 %. Body mass index is 30.21 kg/m.  General: Cooperative, alert, well developed, in no acute distress. HEENT: Conjunctivae and lids unremarkable. Cardiovascular: Regular rhythm.  Lungs: Normal work of breathing. Neurologic: No focal deficits.   Lab Results  Component Value Date   CREATININE 0.67 09/09/2020   BUN 13 09/09/2020   NA 139 09/09/2020   K 4.0 09/09/2020   CL 99 09/09/2020   CO2 21 09/09/2020   Lab Results  Component Value Date   ALT 38 (H) 12/21/2020   AST 19 09/09/2020   ALKPHOS 69 09/09/2020   BILITOT 0.6 09/09/2020   Lab Results  Component Value Date   HGBA1C 6.0 (H) 12/21/2020   HGBA1C 6.1 (H) 09/09/2020   HGBA1C 5.7 (H) 06/16/2015   Lab Results  Component Value Date   INSULIN 12.0 09/09/2020   Lab Results  Component Value Date   TSH 1.060 09/09/2020   Lab Results  Component Value Date    CHOL 149 12/21/2020   HDL 55 12/21/2020   LDLCALC 72 12/21/2020   TRIG 124 12/21/2020   CHOLHDL 5.6 06/16/2015   Lab Results  Component Value Date   VD25OH 55.8 09/09/2020   Lab Results  Component Value Date   WBC 4.9 09/09/2020   HGB 14.5 09/09/2020   HCT 43.1 09/09/2020   MCV 88 09/09/2020   PLT 215 09/09/2020    Attestation Statements:   Reviewed by clinician on day of visit: allergies, medications, problem list, medical history, surgical history, family history, social history, and previous encounter notes.  Coral Ceo, CMA, am acting as transcriptionist for [employer], DO.  I have reviewed the above documentation for accuracy and completeness, and I agree with the above. Marjory Sneddon, D.O.  The [place] was signed into law in 2016 which includes the topic of electronic health records.  This provides immediate access to information in MyChart.  This includes consultation notes, operative notes, office notes, lab results and pathology reports.  If you have any questions about what you read please let us  know at your next visit so we can discuss your concerns and take corrective action if need be.  We are right here with you.

## 2020-12-23 ENCOUNTER — Other Ambulatory Visit (HOSPITAL_COMMUNITY): Payer: Self-pay

## 2020-12-23 MED ORDER — ZOSTER VAC RECOMB ADJUVANTED 50 MCG/0.5ML IM SUSR
0.5000 mL | INTRAMUSCULAR | 1 refills | Status: DC
  Filled 2020-12-23: qty 0.5, 1d supply, fill #0
  Filled 2021-03-03 – 2021-03-08 (×2): qty 0.5, 1d supply, fill #1

## 2020-12-25 ENCOUNTER — Other Ambulatory Visit (HOSPITAL_COMMUNITY): Payer: Self-pay

## 2020-12-25 MED ORDER — CARESTART COVID-19 HOME TEST VI KIT
PACK | 0 refills | Status: DC
  Filled 2020-12-25: qty 4, 4d supply, fill #0

## 2021-01-04 ENCOUNTER — Other Ambulatory Visit (HOSPITAL_COMMUNITY): Payer: Self-pay

## 2021-01-13 ENCOUNTER — Ambulatory Visit (INDEPENDENT_AMBULATORY_CARE_PROVIDER_SITE_OTHER): Payer: 59 | Admitting: Family Medicine

## 2021-02-01 ENCOUNTER — Ambulatory Visit (INDEPENDENT_AMBULATORY_CARE_PROVIDER_SITE_OTHER): Payer: 59 | Admitting: Family Medicine

## 2021-02-01 ENCOUNTER — Other Ambulatory Visit (HOSPITAL_COMMUNITY): Payer: Self-pay

## 2021-03-03 ENCOUNTER — Other Ambulatory Visit (HOSPITAL_COMMUNITY): Payer: Self-pay

## 2021-03-08 ENCOUNTER — Other Ambulatory Visit (HOSPITAL_COMMUNITY): Payer: Self-pay

## 2021-03-10 ENCOUNTER — Other Ambulatory Visit (HOSPITAL_COMMUNITY): Payer: Self-pay

## 2021-03-10 MED ORDER — ESCITALOPRAM OXALATE 10 MG PO TABS
20.0000 mg | ORAL_TABLET | Freq: Every day | ORAL | 2 refills | Status: AC
  Filled 2021-03-10: qty 180, 90d supply, fill #0
  Filled 2021-11-19: qty 180, 90d supply, fill #1
  Filled 2022-02-14 – 2022-02-28 (×2): qty 180, 90d supply, fill #2

## 2021-03-22 ENCOUNTER — Other Ambulatory Visit (HOSPITAL_COMMUNITY): Payer: Self-pay

## 2021-03-22 ENCOUNTER — Other Ambulatory Visit (INDEPENDENT_AMBULATORY_CARE_PROVIDER_SITE_OTHER): Payer: Self-pay

## 2021-03-22 ENCOUNTER — Other Ambulatory Visit (INDEPENDENT_AMBULATORY_CARE_PROVIDER_SITE_OTHER): Payer: Self-pay | Admitting: Family Medicine

## 2021-03-22 DIAGNOSIS — E7849 Other hyperlipidemia: Secondary | ICD-10-CM

## 2021-03-25 ENCOUNTER — Other Ambulatory Visit (INDEPENDENT_AMBULATORY_CARE_PROVIDER_SITE_OTHER): Payer: Self-pay

## 2021-03-25 ENCOUNTER — Other Ambulatory Visit (HOSPITAL_COMMUNITY): Payer: Self-pay

## 2021-03-29 ENCOUNTER — Other Ambulatory Visit (HOSPITAL_COMMUNITY): Payer: Self-pay

## 2021-03-29 MED ORDER — ATORVASTATIN CALCIUM 40 MG PO TABS
40.0000 mg | ORAL_TABLET | Freq: Every day | ORAL | 3 refills | Status: DC
  Filled 2021-03-29: qty 90, 90d supply, fill #0
  Filled 2021-06-30: qty 90, 90d supply, fill #1

## 2021-03-30 ENCOUNTER — Other Ambulatory Visit (HOSPITAL_COMMUNITY): Payer: Self-pay

## 2021-04-28 DIAGNOSIS — D229 Melanocytic nevi, unspecified: Secondary | ICD-10-CM | POA: Diagnosis not present

## 2021-04-28 DIAGNOSIS — R6882 Decreased libido: Secondary | ICD-10-CM | POA: Diagnosis not present

## 2021-04-28 DIAGNOSIS — Z01419 Encounter for gynecological examination (general) (routine) without abnormal findings: Secondary | ICD-10-CM | POA: Diagnosis not present

## 2021-06-21 ENCOUNTER — Encounter: Payer: Self-pay | Admitting: Plastic Surgery

## 2021-06-21 ENCOUNTER — Ambulatory Visit: Payer: 59 | Admitting: Plastic Surgery

## 2021-06-21 ENCOUNTER — Other Ambulatory Visit (HOSPITAL_COMMUNITY)
Admission: RE | Admit: 2021-06-21 | Discharge: 2021-06-21 | Disposition: A | Discharge status: Home / Self Care | Payer: 59 | Source: Ambulatory Visit | Attending: Plastic Surgery | Admitting: Plastic Surgery

## 2021-06-21 ENCOUNTER — Other Ambulatory Visit: Payer: Self-pay

## 2021-06-21 DIAGNOSIS — L989 Disorder of the skin and subcutaneous tissue, unspecified: Secondary | ICD-10-CM | POA: Insufficient documentation

## 2021-06-21 DIAGNOSIS — L820 Inflamed seborrheic keratosis: Secondary | ICD-10-CM | POA: Diagnosis not present

## 2021-06-21 DIAGNOSIS — L821 Other seborrheic keratosis: Secondary | ICD-10-CM | POA: Diagnosis not present

## 2021-06-21 NOTE — Progress Notes (Signed)
Procedure Note  Preoperative Dx: left leg changing skin lesion  Postoperative Dx: Same  Procedure: Excision of left changing skin lesion 4 mm  Anesthesia: Lidocaine 1% with 1:100,000 epinephrine  Indication for Procedure: skin lesion  Description of Procedure: Risks and complications were explained to the patient.  Consent was confirmed and the patient understands the risks and benefits.  The potential complications and alternatives were explained and the patient consents.  The patient expressed understanding the option of not having the procedure and the risks of a scar.  Time out was called and all information was confirmed to be correct.    The area was prepped and drapped.  Lidocaine 1% with epinepherine was injected in the subcutaneous area.  After waiting several minutes for the local to take affect a circular 4 mm blade was used to excise the area.  The skin edges were reapproximated with 5-0 Monocryl subcuticular running closure.  A dressing was applied.  The patient was given instructions on how to care for the area and a follow up appointment.  Hannah Brady tolerated the procedure well and there were no complications. The specimen was sent to pathology.

## 2021-06-22 ENCOUNTER — Ambulatory Visit: Payer: 59 | Admitting: Plastic Surgery

## 2021-06-24 LAB — SURGICAL PATHOLOGY

## 2021-06-29 ENCOUNTER — Other Ambulatory Visit (HOSPITAL_COMMUNITY): Payer: Self-pay

## 2021-06-29 ENCOUNTER — Other Ambulatory Visit (INDEPENDENT_AMBULATORY_CARE_PROVIDER_SITE_OTHER): Payer: Self-pay

## 2021-06-30 ENCOUNTER — Other Ambulatory Visit (HOSPITAL_COMMUNITY): Payer: Self-pay

## 2021-07-06 ENCOUNTER — Other Ambulatory Visit: Payer: Self-pay

## 2021-07-06 ENCOUNTER — Ambulatory Visit (INDEPENDENT_AMBULATORY_CARE_PROVIDER_SITE_OTHER): Payer: 59 | Admitting: Surgical

## 2021-07-06 DIAGNOSIS — L989 Disorder of the skin and subcutaneous tissue, unspecified: Secondary | ICD-10-CM

## 2021-07-06 NOTE — Progress Notes (Signed)
° °  Subjective:     Patient ID: Hannah Brady, female    DOB: [birth year], 61 y.o.   MRN: [id number]  Chief Complaint  Patient presents with   Follow-up    HPI: The patient is a 61 y.o. female here for follow-up excision of left changing skin lesion 4 mm with Dr. Marla Roe on 06/21/2021.  She is doing well.  She just got back from [place] recently.  She is not having any issues with her leg.  She is aware of the pathology results.  She has no concerns.   Review of Systems  Constitutional: Negative.     Objective:   Vital Signs There were no vitals taken for this visit. Vital Signs and Nursing Note Reviewed Physical Exam Constitutional:      Appearance: Normal appearance.  HENT:     Head: Normocephalic and atraumatic.  Skin:         Comments: Left leg incision intact, healing well, some scabbing noted.  Monocryl suture knot noted.  No surrounding erythema.  No tenderness noted.  Neurological:     Mental Status: She is alert.      Assessment/Plan:     ICD-10-CM   1. Changing skin lesion  L98.9       Monocryl suture knot was clipped at the knot.  Discussed with patient the rest will absorb over the next few weeks.  Recommend following up as needed.  No signs of infection.  Recommend calling with questions or concerns.  Carola Rhine Maybree Riling, PA-C 07/06/2021, 11:43 AM

## 2021-10-04 ENCOUNTER — Other Ambulatory Visit (HOSPITAL_COMMUNITY): Payer: Self-pay

## 2021-10-04 MED ORDER — ATORVASTATIN CALCIUM 40 MG PO TABS
40.0000 mg | ORAL_TABLET | Freq: Every day | ORAL | 4 refills | Status: DC
Start: 2021-10-04 — End: 2023-03-22
  Filled 2021-10-04: qty 90, 90d supply, fill #0
  Filled 2021-11-29 – 2021-12-29 (×2): qty 90, 90d supply, fill #1
  Filled 2022-03-27: qty 90, 90d supply, fill #2
  Filled 2022-06-27: qty 90, 90d supply, fill #3
  Filled 2022-09-23: qty 90, 90d supply, fill #4

## 2021-11-19 ENCOUNTER — Other Ambulatory Visit (INDEPENDENT_AMBULATORY_CARE_PROVIDER_SITE_OTHER): Payer: Self-pay | Admitting: Family Medicine

## 2021-11-19 ENCOUNTER — Other Ambulatory Visit (HOSPITAL_COMMUNITY): Payer: Self-pay

## 2021-11-19 DIAGNOSIS — E7849 Other hyperlipidemia: Secondary | ICD-10-CM

## 2021-11-19 DIAGNOSIS — M79675 Pain in left toe(s): Secondary | ICD-10-CM | POA: Diagnosis not present

## 2021-11-22 ENCOUNTER — Other Ambulatory Visit (HOSPITAL_COMMUNITY): Payer: Self-pay

## 2021-11-29 ENCOUNTER — Other Ambulatory Visit (HOSPITAL_COMMUNITY): Payer: Self-pay

## 2021-12-28 ENCOUNTER — Ambulatory Visit: Payer: 59 | Admitting: Podiatry

## 2021-12-28 DIAGNOSIS — Z8673 Personal history of transient ischemic attack (TIA), and cerebral infarction without residual deficits: Secondary | ICD-10-CM | POA: Insufficient documentation

## 2021-12-28 DIAGNOSIS — K219 Gastro-esophageal reflux disease without esophagitis: Secondary | ICD-10-CM | POA: Insufficient documentation

## 2021-12-28 DIAGNOSIS — N951 Menopausal and female climacteric states: Secondary | ICD-10-CM | POA: Insufficient documentation

## 2021-12-28 DIAGNOSIS — E669 Obesity, unspecified: Secondary | ICD-10-CM | POA: Insufficient documentation

## 2021-12-28 DIAGNOSIS — L603 Nail dystrophy: Secondary | ICD-10-CM

## 2021-12-28 DIAGNOSIS — L601 Onycholysis: Secondary | ICD-10-CM | POA: Diagnosis not present

## 2021-12-28 DIAGNOSIS — L608 Other nail disorders: Secondary | ICD-10-CM | POA: Diagnosis not present

## 2021-12-28 NOTE — Progress Notes (Signed)
Subjective:  Patient ID: Hannah Brady, female    DOB: April 24, 1961,  MRN: [id number] HPI Chief Complaint  Patient presents with   Foot Problem    BIL fungus on nails. TRIED OTC TREATMENTS ,     61 y.o. female presents with the above complaint.   ROS: Denies fever chills nausea vomiting muscle aches pains calf pain back pain chest pain shortness of breath.  Past Medical History:  Diagnosis Date   Complication of anesthesia    Hyperlipidemia    Mini stroke    Other fatigue    PONV (postoperative nausea and vomiting)    Stroke ([place])    no defecits   Past Surgical History:  Procedure Laterality Date   BREAST EXCISIONAL BIOPSY Right    CESAREAN SECTION     CHONDROPLASTY Left 03/25/2016   Procedure: CHONDROPLASTY;  Surgeon: Melrose Nakayama, MD;  Location: [facility];  Service: Orthopedics;  Laterality: Left;   EP IMPLANTABLE DEVICE N/A 06/17/2015   Procedure: Loop Recorder Insertion;  Surgeon: Deboraha Sprang, MD;  Location: [facility];  Service: Cardiovascular;  Laterality: N/A;   KNEE ARTHROSCOPY Left 03/25/2016   Procedure: ARTHROSCOPY KNEE;  Surgeon: Melrose Nakayama, MD;  Location: [facility];  Service: Orthopedics;  Laterality: Left;   KNEE ARTHROSCOPY WITH MEDIAL MENISECTOMY Left 03/25/2016   Procedure: KNEE ARTHROSCOPY WITH MEDIAL MENISECTOMY;  Surgeon: Melrose Nakayama, MD;  Location: [facility];  Service: Orthopedics;  Laterality: Left;   TEE WITHOUT CARDIOVERSION N/A 06/17/2015   Procedure: TRANSESOPHAGEAL ECHOCARDIOGRAM (TEE);  Surgeon: Pixie Casino, MD;  Location: [facility];  Service: Cardiovascular;  Laterality: N/A;    Current Outpatient Medications:    aspirin EC 81 MG tablet, Take 1 tablet (81 mg total) by mouth daily., Disp: 90 tablet, Rfl: 3   atorvastatin (LIPITOR) 40 MG tablet, Take 1 tablet (40 mg total) by mouth daily at 6 PM., Disp: 90 tablet, Rfl: 4   Biotin 10000 MCG TABS, Take 1 tablet by mouth  daily., Disp: , Rfl:    Cholecalciferol (VITAMIN D) 125 MCG (5000 UT) CAPS, Take 1 capsule by mouth daily., Disp: , Rfl:    Cinnamon 500 MG TABS, Take 1,000 mg by mouth daily. Take 2 tablets (1048m total) by mouth once daily., Disp: , Rfl:    COVID-19 At Home Antigen Test (CARESTART COVID-19 HOME TEST) KIT, Use as directed, Disp: 4 each, Rfl: 0   escitalopram (LEXAPRO) 10 MG tablet, Take 2 tablets (20 mg total) by mouth daily- can decrease to 1 tab daily as needed, Disp: 180 tablet, Rfl: 2   Melatonin 1 MG CHEW, Chew 6 mg by mouth See admin instructions. Chew 679mtotal by mouth 6 days per week at bedtime., Disp: , Rfl:    Multiple Vitamin (MULTIVITAMIN WITH MINERALS) TABS tablet, Take 1 tablet by mouth daily., Disp: , Rfl:    Zoster Vaccine Adjuvanted (SHINGRIX) injection, Inject 0.5 mLs into the muscle., Disp: 0.5 mL, Rfl: 1  No Known Allergies Review of Systems Objective:  There were no vitals filed for this visit.  General: Well developed, nourished, in no acute distress, alert and oriented x3   Dermatological: Skin is warm, dry and supple bilateral. Nails x 10 are well maintained; remaining integument appears unremarkable at this time. There are no open sores, no preulcerative lesions, no rash or signs of infection present.  Toenails demonstrate distal thickening with subungual debris.  Hallux nails appear to be the worst but currently are  covered with acrylic.  Vascular: Dorsalis Pedis artery and Posterior Tibial artery pedal pulses are 2/4 bilateral with immedate capillary fill time. Pedal hair growth present. No varicosities and no lower extremity edema present bilateral.   Neruologic: Grossly intact via light touch bilateral. Vibratory intact via tuning fork bilateral. Protective threshold with Semmes Wienstein monofilament intact to all pedal sites bilateral. Patellar and Achilles deep tendon reflexes 2+ bilateral. No Babinski or clonus noted bilateral.   Musculoskeletal: No gross  boney pedal deformities bilateral. No pain, crepitus, or limitation noted with foot and ankle range of motion bilateral. Muscular strength 5/5 in all groups tested bilateral.  Gait: Unassisted, Nonantalgic.    Radiographs:  None taken  Assessment & Plan:   Assessment: Nail dystrophy.  Plan: Samples of the skin and nail were taken today for pathologic evaluation follow-up with her in 1 month     Zackery Brine T. [place], [place]

## 2021-12-29 ENCOUNTER — Other Ambulatory Visit (HOSPITAL_COMMUNITY): Payer: Self-pay

## 2021-12-29 ENCOUNTER — Encounter (INDEPENDENT_AMBULATORY_CARE_PROVIDER_SITE_OTHER): Payer: Self-pay

## 2022-01-18 ENCOUNTER — Other Ambulatory Visit (HOSPITAL_COMMUNITY): Payer: Self-pay

## 2022-01-18 DIAGNOSIS — H1031 Unspecified acute conjunctivitis, right eye: Secondary | ICD-10-CM | POA: Diagnosis not present

## 2022-01-18 MED ORDER — POLYMYXIN B-TRIMETHOPRIM 10000-0.1 UNIT/ML-% OP SOLN
1.0000 [drp] | Freq: Four times a day (QID) | OPHTHALMIC | 0 refills | Status: DC
  Filled 2022-01-18: qty 10, 5d supply, fill #0

## 2022-01-27 ENCOUNTER — Other Ambulatory Visit: Payer: Self-pay | Admitting: Internal Medicine

## 2022-01-27 DIAGNOSIS — R7303 Prediabetes: Secondary | ICD-10-CM | POA: Diagnosis not present

## 2022-01-27 DIAGNOSIS — Z0000 Encounter for general adult medical examination without abnormal findings: Secondary | ICD-10-CM | POA: Diagnosis not present

## 2022-01-27 DIAGNOSIS — Z1231 Encounter for screening mammogram for malignant neoplasm of breast: Secondary | ICD-10-CM

## 2022-01-27 DIAGNOSIS — Z8673 Personal history of transient ischemic attack (TIA), and cerebral infarction without residual deficits: Secondary | ICD-10-CM | POA: Diagnosis not present

## 2022-01-27 DIAGNOSIS — E669 Obesity, unspecified: Secondary | ICD-10-CM | POA: Diagnosis not present

## 2022-01-27 DIAGNOSIS — E7800 Pure hypercholesterolemia, unspecified: Secondary | ICD-10-CM | POA: Diagnosis not present

## 2022-02-01 ENCOUNTER — Ambulatory Visit: Payer: 59

## 2022-02-01 ENCOUNTER — Ambulatory Visit
Admission: RE | Admit: 2022-02-01 | Discharge: 2022-02-01 | Disposition: A | Discharge status: Home / Self Care | Payer: 59 | Source: Ambulatory Visit | Attending: Internal Medicine | Admitting: Internal Medicine

## 2022-02-01 DIAGNOSIS — Z1231 Encounter for screening mammogram for malignant neoplasm of breast: Secondary | ICD-10-CM | POA: Diagnosis not present

## 2022-02-04 ENCOUNTER — Other Ambulatory Visit (HOSPITAL_COMMUNITY): Payer: Self-pay

## 2022-02-04 DIAGNOSIS — R058 Other specified cough: Secondary | ICD-10-CM | POA: Diagnosis not present

## 2022-02-04 DIAGNOSIS — J069 Acute upper respiratory infection, unspecified: Secondary | ICD-10-CM | POA: Diagnosis not present

## 2022-02-04 MED ORDER — HYDROCODONE BIT-HOMATROP MBR 5-1.5 MG/5ML PO SOLN
5.0000 mL | Freq: Every evening | ORAL | 0 refills | Status: DC | PRN
  Filled 2022-02-04: qty 50, 10d supply, fill #0

## 2022-02-04 MED ORDER — BENZONATATE 100 MG PO CAPS
100.0000 mg | ORAL_CAPSULE | Freq: Three times a day (TID) | ORAL | 0 refills | Status: DC | PRN
  Filled 2022-02-04: qty 15, 5d supply, fill #0

## 2022-02-10 ENCOUNTER — Ambulatory Visit: Payer: 59 | Admitting: Podiatry

## 2022-02-10 DIAGNOSIS — H93293 Other abnormal auditory perceptions, bilateral: Secondary | ICD-10-CM | POA: Diagnosis not present

## 2022-02-14 ENCOUNTER — Other Ambulatory Visit (HOSPITAL_COMMUNITY): Payer: Self-pay

## 2022-02-14 DIAGNOSIS — H903 Sensorineural hearing loss, bilateral: Secondary | ICD-10-CM | POA: Diagnosis not present

## 2022-02-22 ENCOUNTER — Other Ambulatory Visit (HOSPITAL_COMMUNITY): Payer: Self-pay

## 2022-02-25 DIAGNOSIS — M7742 Metatarsalgia, left foot: Secondary | ICD-10-CM | POA: Diagnosis not present

## 2022-02-28 ENCOUNTER — Other Ambulatory Visit (HOSPITAL_COMMUNITY): Payer: Self-pay

## 2022-03-01 ENCOUNTER — Encounter: Payer: Self-pay | Admitting: *Deleted

## 2022-03-01 ENCOUNTER — Ambulatory Visit: Payer: 59 | Admitting: Podiatry

## 2022-03-01 ENCOUNTER — Other Ambulatory Visit (HOSPITAL_COMMUNITY): Payer: Self-pay

## 2022-03-01 MED ORDER — UREA NAIL 45 % EX GEL
1.0000 | Freq: Every day | CUTANEOUS | 6 refills | Status: DC
  Filled 2022-03-01: qty 28, 30d supply, fill #0
  Filled 2022-03-27: qty 28, 30d supply, fill #1
  Filled 2022-04-27 – 2022-06-27 (×2): qty 28, 30d supply, fill #2

## 2022-03-02 ENCOUNTER — Other Ambulatory Visit (HOSPITAL_COMMUNITY): Payer: Self-pay

## 2022-03-21 ENCOUNTER — Other Ambulatory Visit (HOSPITAL_COMMUNITY): Payer: Self-pay

## 2022-03-21 DIAGNOSIS — J209 Acute bronchitis, unspecified: Secondary | ICD-10-CM | POA: Diagnosis not present

## 2022-03-21 MED ORDER — PREDNISONE 5 MG PO TABS
ORAL_TABLET | ORAL | 0 refills | Status: DC
  Filled 2022-03-21: qty 21, 6d supply, fill #0

## 2022-03-21 MED ORDER — HYDROCODONE BIT-HOMATROP MBR 5-1.5 MG/5ML PO SOLN
5.0000 mL | Freq: Every evening | ORAL | 0 refills | Status: DC | PRN
  Filled 2022-03-21: qty 50, 10d supply, fill #0

## 2022-03-21 MED ORDER — ALBUTEROL SULFATE HFA 108 (90 BASE) MCG/ACT IN AERS
2.0000 | INHALATION_SPRAY | RESPIRATORY_TRACT | 0 refills | Status: DC | PRN
  Filled 2022-03-21: qty 6.7, 17d supply, fill #0

## 2022-03-24 ENCOUNTER — Other Ambulatory Visit (HOSPITAL_COMMUNITY): Payer: Self-pay

## 2022-03-24 MED ORDER — FLUCONAZOLE 150 MG PO TABS
150.0000 mg | ORAL_TABLET | Freq: Every day | ORAL | 0 refills | Status: DC
  Filled 2022-03-24: qty 2, 4d supply, fill #0

## 2022-03-24 MED ORDER — AMOXICILLIN-POT CLAVULANATE 875-125 MG PO TABS
1.0000 | ORAL_TABLET | Freq: Two times a day (BID) | ORAL | 0 refills | Status: DC
  Filled 2022-03-24: qty 14, 7d supply, fill #0

## 2022-03-25 ENCOUNTER — Other Ambulatory Visit (HOSPITAL_COMMUNITY): Payer: Self-pay

## 2022-03-28 ENCOUNTER — Other Ambulatory Visit (HOSPITAL_COMMUNITY): Payer: Self-pay

## 2022-03-29 ENCOUNTER — Other Ambulatory Visit (HOSPITAL_COMMUNITY): Payer: Self-pay

## 2022-04-01 DIAGNOSIS — M7742 Metatarsalgia, left foot: Secondary | ICD-10-CM | POA: Diagnosis not present

## 2022-04-27 ENCOUNTER — Other Ambulatory Visit (HOSPITAL_COMMUNITY): Payer: Self-pay

## 2022-04-27 DIAGNOSIS — Z01419 Encounter for gynecological examination (general) (routine) without abnormal findings: Secondary | ICD-10-CM | POA: Diagnosis not present

## 2022-04-27 DIAGNOSIS — Z780 Asymptomatic menopausal state: Secondary | ICD-10-CM | POA: Diagnosis not present

## 2022-05-13 ENCOUNTER — Other Ambulatory Visit (HOSPITAL_COMMUNITY): Payer: Self-pay

## 2022-06-27 ENCOUNTER — Other Ambulatory Visit (HOSPITAL_COMMUNITY): Payer: Self-pay

## 2022-06-27 ENCOUNTER — Other Ambulatory Visit: Payer: Self-pay

## 2022-06-28 ENCOUNTER — Other Ambulatory Visit (HOSPITAL_COMMUNITY): Payer: Self-pay

## 2022-06-28 MED ORDER — ESCITALOPRAM OXALATE 10 MG PO TABS
10.0000 mg | ORAL_TABLET | Freq: Every day | ORAL | 2 refills | Status: DC
  Filled 2022-06-28: qty 90, 90d supply, fill #0
  Filled 2022-09-23: qty 90, 90d supply, fill #1
  Filled 2022-12-21: qty 90, 90d supply, fill #2

## 2022-07-13 DIAGNOSIS — H5213 Myopia, bilateral: Secondary | ICD-10-CM | POA: Diagnosis not present

## 2022-09-23 ENCOUNTER — Other Ambulatory Visit (HOSPITAL_COMMUNITY): Payer: Self-pay

## 2022-09-27 ENCOUNTER — Other Ambulatory Visit (HOSPITAL_COMMUNITY): Payer: Self-pay

## 2022-10-04 ENCOUNTER — Other Ambulatory Visit (HOSPITAL_COMMUNITY): Payer: Self-pay

## 2022-10-04 ENCOUNTER — Telehealth: Payer: Commercial Managed Care - PPO | Admitting: Nurse Practitioner

## 2022-10-04 DIAGNOSIS — S40869A Insect bite (nonvenomous) of unspecified upper arm, initial encounter: Secondary | ICD-10-CM

## 2022-10-04 DIAGNOSIS — W57XXXA Bitten or stung by nonvenomous insect and other nonvenomous arthropods, initial encounter: Secondary | ICD-10-CM | POA: Diagnosis not present

## 2022-10-04 DIAGNOSIS — L03119 Cellulitis of unspecified part of limb: Secondary | ICD-10-CM | POA: Diagnosis not present

## 2022-10-04 DIAGNOSIS — L237 Allergic contact dermatitis due to plants, except food: Secondary | ICD-10-CM | POA: Diagnosis not present

## 2022-10-04 MED ORDER — TRIAMCINOLONE ACETONIDE 0.1 % EX CREA
1.0000 | TOPICAL_CREAM | Freq: Two times a day (BID) | CUTANEOUS | 0 refills | Status: DC
  Filled 2022-10-04: qty 30, 15d supply, fill #0

## 2022-10-04 MED ORDER — FLUCONAZOLE 150 MG PO TABS
150.0000 mg | ORAL_TABLET | Freq: Every day | ORAL | 0 refills | Status: DC
  Filled 2022-10-04: qty 1, 1d supply, fill #0

## 2022-10-04 MED ORDER — CEPHALEXIN 500 MG PO CAPS
500.0000 mg | ORAL_CAPSULE | Freq: Three times a day (TID) | ORAL | 0 refills | Status: DC
  Filled 2022-10-04: qty 21, 7d supply, fill #0

## 2022-10-04 MED ORDER — LORATADINE 10 MG PO TABS
10.0000 mg | ORAL_TABLET | Freq: Every day | ORAL | 0 refills | Status: AC
Start: 2022-10-04 — End: ?
  Filled 2022-10-04: qty 30, 30d supply, fill #0
  Filled 2022-12-21: qty 100, 100d supply, fill #0

## 2022-10-04 MED ORDER — CEPHALEXIN 500 MG PO CAPS
500.0000 mg | ORAL_CAPSULE | Freq: Four times a day (QID) | ORAL | 0 refills | Status: AC
Start: 2022-10-04 — End: 2022-10-09
  Filled 2022-10-04: qty 20, 5d supply, fill #0

## 2022-10-04 MED ORDER — PREDNISONE 20 MG PO TABS
20.0000 mg | ORAL_TABLET | Freq: Two times a day (BID) | ORAL | 0 refills | Status: AC
  Filled 2022-10-04: qty 10, 5d supply, fill #0

## 2022-10-04 NOTE — Progress Notes (Signed)
E-Visit for Insect Sting  Thank you for describing the insect sting for [place].  Here is how we plan to help!  A sting that we will treat with a short course of prednisone.and an antibiotic and an allergy medication (Claritin) Meds ordered this encounter  Medications   predniSONE (DELTASONE) 20 MG tablet    Sig: Take 1 tablet (20 mg total) by mouth 2 (two) times daily with a meal for 5 days.    Dispense:  10 tablet    Refill:  0   cephALEXin (KEFLEX) 500 MG capsule    Sig: Take 1 capsule (500 mg total) by mouth 4 (four) times daily for 5 days.    Dispense:  20 capsule    Refill:  0   loratadine (CLARITIN) 10 MG tablet    Sig: Take 1 tablet (10 mg total) by mouth daily.    Dispense:  30 tablet    Refill:  0    The 2 greatest risks from insect stings are allergic reaction, which can be fatal in some people and infection, which is more common and less serious.  Bees, wasps, yellow jackets, and hornets belong to a class of insects called Hymenoptera.  Most insect stings cause only minor discomfort.  Stings can happen anywhere on the body and can be painful.  Most stings are from honey bees or yellow jackets.  Fire ants can sting multiple times.  The sites of the stings are more likely to become infected.    Provided a home care guide for insect stings and instructions on when to call for help. and Your symptoms indicate that you need a longer course of antibiotics and a follow up visit with a provider.  I have sent prednisone prescription to the pharmacy that you selected.  You will need to schedule a follow up visit with your provider/ primary care doctor to assure the area is improving by end of week.    What can be used to prevent Insect Stings?  Insect repellant with at least 20% DEET.  Wearing long pants and shirts with socks and shoes.  Wear dark or drab-colored clothes rather than bright colors.  Avoid using perfumes and hair sprays; these attract insects.  HOME CARE ADVICE:  1.  Stinger removal: The stinger looks like a tiny black dot in the sting. Use a fingernail, credit card edge, or knife-edge to scrape it off.  Don't pull it out because it squeezes out more venom. If the stinger is below the skin surface, leave it alone.  It will be shed with normal skin healing. 2. Use cold compresses to the area of the sting for 10-20 minutes.  You may repeat this as needed to relieve symptoms of pain and swelling. 3.  For pain relief, take acetominophen 650 mg 4-6 hours as needed  4.  You can also use hydrocortisone cream 0.5% or 1% up to 4 times daily as needed for itching. 5.  If the sting becomes very itchy, take Benadryl 25-50 mg, follow directions on box. 6.  Wash the area 2-3 times daily with antibacterial soap and warm water. 7. Call your Doctor if: Fever, a severe headache, or rash occur in the next 2 weeks. Sting area begins to look infected. Redness and swelling worsens after home treatment. Your current symptoms become worse.    MAKE SURE YOU:  Understand these instructions. Will watch your condition. Will get help right away if you are not doing well or get worse.  Thank  you for choosing an e-visit.  Your e-visit answers were reviewed by a board certified advanced clinical practitioner to complete your personal care plan. Depending upon the condition, your plan could have included both over the counter or prescription medications.  Please review your pharmacy choice. Make sure the pharmacy is open so you can pick up prescription now. If there is a problem, you may contact your provider through [employer] and have the prescription routed to another pharmacy.  Your safety is important to [place]. If you have drug allergies check your prescription carefully.   For the next 24 hours you can use MyChart to ask questions about today's visit, request a non-urgent call back, or ask for a work or school excuse. You will get an email in the next two days asking  about your experience. I hope that your e-visit has been valuable and will speed your recovery.   I spent approximately 5 minutes reviewing the patient's history, current symptoms and coordinating their care today.

## 2022-10-06 ENCOUNTER — Ambulatory Visit: Payer: Commercial Managed Care - PPO | Admitting: Orthopedic Surgery

## 2022-10-06 ENCOUNTER — Encounter: Payer: Self-pay | Admitting: Orthopedic Surgery

## 2022-10-06 ENCOUNTER — Other Ambulatory Visit (INDEPENDENT_AMBULATORY_CARE_PROVIDER_SITE_OTHER): Payer: Commercial Managed Care - PPO

## 2022-10-06 ENCOUNTER — Other Ambulatory Visit (HOSPITAL_COMMUNITY): Payer: Self-pay

## 2022-10-06 DIAGNOSIS — M25521 Pain in right elbow: Secondary | ICD-10-CM

## 2022-10-06 DIAGNOSIS — M7021 Olecranon bursitis, right elbow: Secondary | ICD-10-CM

## 2022-10-06 MED ORDER — TRAMADOL HCL 50 MG PO TABS
50.0000 mg | ORAL_TABLET | Freq: Three times a day (TID) | ORAL | 0 refills | Status: DC | PRN
  Filled 2022-10-06: qty 30, 10d supply, fill #0

## 2022-10-06 NOTE — Progress Notes (Signed)
Office Visit Note   Patient: Hannah Brady           Date of Birth: 08-31-1960           MRN: [id number] Visit Date: 10/06/2022 Requested by: Steva Ready, DO [address] [place],  [place] [address] PCP: Steva Ready, DO  Subjective: Chief Complaint  Patient presents with   Right Elbow - Pain    HPI: Hannah Brady is a 62 y.o. female who presents to the office reporting approximately 10-day history of right elbow pain.  She fell like she may have some poison ivy around the elbow but reported significant increase in pain over the past several days.  Was seen by her primary care provider who thought it might be poison ivy or olecranon bursitis with cellulitis.  She was placed on Keflex and steroids.  Elbow pain has worsened over the past several days.  She does not report any fevers or chills.  Going on a trip tomorrow to [place]..                ROS: All systems reviewed are negative as they relate to the chief complaint within the history of present illness.  Patient denies fevers or chills.  Assessment & Plan: Visit Diagnoses:  1. Pain in right elbow     Plan: Impression is right elbow infectious olecranon bursitis with bursal fluid and that area of the olecranon bursa.  Elbow range of motion otherwise intact.  Has about a 5 cm radius diameter of erythema without tissue crepitus around the olecranon bursal close fluid collection.  Plan at this time is incision and drainage of this olecranon bursa after sterile prepping and draping.  We did send cultures.  Packing is performed.  That will need to be redone on Saturday and then she should come back in on Monday for clinical recheck.  May need to extend her antibiotic dosing for another 7 days at that time.  May not grow cultures because she is on antibiotics at this time.  Follow-Up Instructions: No follow-ups on file.   Orders:  Orders Placed This Encounter  Procedures   Anaerobic and Aerobic  Culture   XR Elbow 2 Views Right   Meds ordered this encounter  Medications   traMADol (ULTRAM) 50 MG tablet    Sig: Take 1 tablet (50 mg total) by mouth every 8 (eight) hours as needed.    Dispense:  30 tablet    Refill:  0      Procedures: Incision & Drainage  Date/Time: 10/06/2022 8:37 PM  Performed by: Cammy Copa, MD Authorized by: Cammy Copa, MD   Consent:    Consent obtained:  Verbal   Consent given by:  Patient   Risks, benefits, and alternatives were discussed: yes     Risks discussed:  Bleeding, incomplete drainage, infection and pain   Alternatives discussed:  No treatment Location:    Type:  Fluid collection   Location:  Upper extremity   Upper extremity location:  Elbow   Elbow location:  R elbow Pre-procedure details:    Skin preparation:  Alcohol and povidone-iodine Sedation:    Sedation type:  None Anesthesia:    Anesthesia method:  Local infiltration   Local anesthetic:  Bupivacaine 0.25% w/o epi Procedure type:    Complexity:  Simple Procedure details:    Needle aspiration: no     Incision types:  Single straight   Incision depth:  Subcutaneous  Scalpel blade:  15   Wound management:  Probed and deloculated and irrigated with saline   Drainage:  Serosanguinous   Drainage amount:  Moderate   Wound treatment:  Wound left open   Packing materials:  1/4 in iodoform gauze Post-procedure details:    Procedure completion:  Tolerated well, no immediate complications    Clinical Data: No additional findings.  Objective: Vital Signs: There were no vitals taken for this visit.  Physical Exam:  Constitutional: Patient appears well-developed HEENT:  Head: Normocephalic Eyes:EOM are normal Neck: Normal range of motion Cardiovascular: Normal rate Pulmonary/chest: Effort normal Neurologic: Patient is alert Skin: Skin is warm Psychiatric: Patient has normal mood and affect  Ortho Exam: Ortho exam demonstrates full active and  passive range of motion of the elbow.  No proximal lymphadenopathy.  She does have elbow bursa fluid.  The bursa itself is tender to palpation.  Surrounding cellulitis is present and a 5 cm radius around the olecranon tip.  No tissue crepitus is present.  Extensor mechanism intact.  Specialty Comments:  No specialty comments available.  Imaging: No results found.   PMFS History: Patient Active Problem List   Diagnosis Date Noted   Personal history of transient ischemic attack (TIA), and cerebral infarction without residual deficits 12/28/2021   Obesity 12/28/2021   Menopausal symptom 12/28/2021   Gastro-esophageal reflux disease without esophagitis 12/28/2021   Menopausal and female climacteric states 12/28/2021   Changing skin lesion 06/21/2021   Other hyperlipidemia 10/07/2020   Hyperlipemia 03/23/2016   TIA (transient ischemic attack) 06/15/2015   Elevated blood pressure 06/15/2015   Hyperglycemia 06/15/2015   Headache, common migraine 06/15/2015   Acute CVA (cerebrovascular accident) (HCC) 06/15/2015   Nonintractable common migraine    Left-sided weakness    Past Medical History:  Diagnosis Date   Complication of anesthesia    Hyperlipidemia    Mini stroke    Other fatigue    PONV (postoperative nausea and vomiting)    Stroke (HCC)    no defecits    Family History  Problem Relation Age of Onset   Liver disease Mother    Alcoholism Mother    Diabetes Father    Alcoholism Father    Breast cancer Neg Hx     Past Surgical History:  Procedure Laterality Date   BREAST EXCISIONAL BIOPSY Right    CESAREAN SECTION     CHONDROPLASTY Left 03/25/2016   Procedure: CHONDROPLASTY;  Surgeon: Marcene Corning, MD;  Location: [facility];  Service: Orthopedics;  Laterality: Left;   EP IMPLANTABLE DEVICE N/A 06/17/2015   Procedure: Loop Recorder Insertion;  Surgeon: Duke Salvia, MD;  Location: [facility] INVASIVE CV LAB;  Service: Cardiovascular;  Laterality: N/A;   KNEE  ARTHROSCOPY Left 03/25/2016   Procedure: ARTHROSCOPY KNEE;  Surgeon: Marcene Corning, MD;  Location: [facility];  Service: Orthopedics;  Laterality: Left;   KNEE ARTHROSCOPY WITH MEDIAL MENISECTOMY Left 03/25/2016   Procedure: KNEE ARTHROSCOPY WITH MEDIAL MENISECTOMY;  Surgeon: Marcene Corning, MD;  Location: [facility];  Service: Orthopedics;  Laterality: Left;   TEE WITHOUT CARDIOVERSION N/A 06/17/2015   Procedure: TRANSESOPHAGEAL ECHOCARDIOGRAM (TEE);  Surgeon: Chrystie Nose, MD;  Location: [facility] ENDOSCOPY;  Service: Cardiovascular;  Laterality: N/A;   Social History   Occupational History   Occupation: Geographical information systems officer    Employer: [employer]  Tobacco Use   Smoking status: Never   Smokeless tobacco: Never  Vaping Use   Vaping Use: Never used  Substance and Sexual Activity   Alcohol use: Yes    Comment: socially   Drug use: No   Sexual activity: Not Currently

## 2022-10-10 ENCOUNTER — Encounter: Payer: Self-pay | Admitting: Surgical

## 2022-10-10 ENCOUNTER — Other Ambulatory Visit (HOSPITAL_COMMUNITY): Payer: Self-pay

## 2022-10-10 ENCOUNTER — Telehealth: Payer: Self-pay

## 2022-10-10 ENCOUNTER — Ambulatory Visit: Payer: Commercial Managed Care - PPO | Admitting: Surgical

## 2022-10-10 DIAGNOSIS — M7021 Olecranon bursitis, right elbow: Secondary | ICD-10-CM

## 2022-10-10 MED ORDER — AMOXICILLIN 500 MG PO CAPS
500.0000 mg | ORAL_CAPSULE | Freq: Three times a day (TID) | ORAL | 0 refills | Status: DC | PRN
  Filled 2022-10-10: qty 30, 10d supply, fill #0

## 2022-10-10 MED ORDER — HYDROCODONE-ACETAMINOPHEN 5-325 MG PO TABS
1.0000 | ORAL_TABLET | Freq: Four times a day (QID) | ORAL | 0 refills | Status: DC | PRN
  Filled 2022-10-10: qty 30, 8d supply, fill #0

## 2022-10-10 NOTE — Telephone Encounter (Signed)
Returned call. Lab results were pulled from website when patient was in the office this morning. No new findings at time of call.

## 2022-10-10 NOTE — Progress Notes (Signed)
Post-I&D Visit Note   Patient: Hannah Brady           Date of Birth: Aug 22, 1960           MRN: [id number] Visit Date: 10/10/2022 PCP: Steva Ready, DO   Assessment & Plan:  Chief Complaint:  Chief Complaint  Patient presents with   Right Elbow - Follow-up   Visit Diagnoses:  1. Olecranon bursitis, right elbow     Plan: Patient returns following right elbow olecranon bursa incision and drainage with wound packing on 10/06/2022.  Overall she states that she is in a lot of pain but she has no systemic symptoms such as fevers, chills, night sweats.  She took the packing out on Saturday and had her sister-in-law repacked this which was very painful.  She has her last dose of Keflex to take tonight.  She has noticed some swelling into her right hand and wrist.  She has been staying in the sling.  On exam, patient has palpable radial pulse.  There is erythema that is not as extensive down the distal portion of the elbow but it does extend a little bit more proximally compared with last visit.  No pain with passive motion of the elbow.  Plan is to continue antibiotics with changing of antibiotics to amoxicillin and Keflex based on the sensitivities from culture that came back showing Staphylococcus aureus.  She will take amoxicillin 500 mg every 8 hours.  Also prescribed Norco to help with pain control instead of the tramadol.  Follow-up on Wednesday for wound recheck and likely repacking.  The wound was repacked today in the clinic with iodoform gauze.  We used about 10 cc of 0.25% bupivacaine without epinephrine to numb up the area.  After gauze packing, a short tail was left and then gauze and ABDs were placed over top of this area with Ace wrap.  Follow-up on Wednesday.  Follow-Up Instructions: No follow-ups on file.   Orders:  No orders of the defined types were placed in this encounter.  Meds ordered this encounter  Medications   HYDROcodone-acetaminophen  (NORCO/VICODIN) 5-325 MG tablet    Sig: Take 1 tablet by mouth every 6 (six) hours as needed for moderate pain.    Dispense:  30 tablet    Refill:  0   amoxicillin (AMOXIL) 500 MG capsule    Sig: Take 1 capsule (500 mg total) by mouth every 8 (eight) hours as needed.    Dispense:  30 capsule    Refill:  0    changing to capsules 10/10/22 mdw    Imaging: No results found.  PMFS History: Patient Active Problem List   Diagnosis Date Noted   Personal history of transient ischemic attack (TIA), and cerebral infarction without residual deficits 12/28/2021   Obesity 12/28/2021   Menopausal symptom 12/28/2021   Gastro-esophageal reflux disease without esophagitis 12/28/2021   Menopausal and female climacteric states 12/28/2021   Changing skin lesion 06/21/2021   Other hyperlipidemia 10/07/2020   Hyperlipemia 03/23/2016   TIA (transient ischemic attack) 06/15/2015   Elevated blood pressure 06/15/2015   Hyperglycemia 06/15/2015   Headache, common migraine 06/15/2015   Acute CVA (cerebrovascular accident) (HCC) 06/15/2015   Nonintractable common migraine    Left-sided weakness    Past Medical History:  Diagnosis Date   Complication of anesthesia    Hyperlipidemia    Mini stroke    Other fatigue    PONV (postoperative nausea and vomiting)    Stroke (HCC)  no defecits    Family History  Problem Relation Age of Onset   Liver disease Mother    Alcoholism Mother    Diabetes Father    Alcoholism Father    Breast cancer Neg Hx     Past Surgical History:  Procedure Laterality Date   BREAST EXCISIONAL BIOPSY Right    CESAREAN SECTION     CHONDROPLASTY Left 03/25/2016   Procedure: CHONDROPLASTY;  Surgeon: Marcene Corning, MD;  Location: [facility];  Service: Orthopedics;  Laterality: Left;   EP IMPLANTABLE DEVICE N/A 06/17/2015   Procedure: Loop Recorder Insertion;  Surgeon: Duke Salvia, MD;  Location: [facility] INVASIVE CV LAB;  Service: Cardiovascular;  Laterality:  N/A;   KNEE ARTHROSCOPY Left 03/25/2016   Procedure: ARTHROSCOPY KNEE;  Surgeon: Marcene Corning, MD;  Location: [facility];  Service: Orthopedics;  Laterality: Left;   KNEE ARTHROSCOPY WITH MEDIAL MENISECTOMY Left 03/25/2016   Procedure: KNEE ARTHROSCOPY WITH MEDIAL MENISECTOMY;  Surgeon: Marcene Corning, MD;  Location:  SURGERY CENTER;  Service: Orthopedics;  Laterality: Left;   TEE WITHOUT CARDIOVERSION N/A 06/17/2015   Procedure: TRANSESOPHAGEAL ECHOCARDIOGRAM (TEE);  Surgeon: Chrystie Nose, MD;  Location: [facility] ENDOSCOPY;  Service: Cardiovascular;  Laterality: N/A;   Social History   Occupational History   Occupation: Administrator, arts: [employer]  Tobacco Use   Smoking status: Never   Smokeless tobacco: Never  Vaping Use   Vaping Use: Never used  Substance and Sexual Activity   Alcohol use: Yes    Comment: socially   Drug use: No   Sexual activity: Not Currently

## 2022-10-10 NOTE — Telephone Encounter (Signed)
[facility] called stating that patient has urgent lab results.  Reference# [id number].  Cb# [phone].  Please advise.  Thank you.

## 2022-10-12 ENCOUNTER — Telehealth: Payer: Self-pay | Admitting: Orthopedic Surgery

## 2022-10-12 ENCOUNTER — Ambulatory Visit: Payer: Commercial Managed Care - PPO | Admitting: Orthopedic Surgery

## 2022-10-12 DIAGNOSIS — M7021 Olecranon bursitis, right elbow: Secondary | ICD-10-CM

## 2022-10-12 LAB — ANAEROBIC AND AEROBIC CULTURE
MICRO NUMBER:: 14965889
MICRO NUMBER:: 14965890
SPECIMEN QUALITY:: ADEQUATE
SPECIMEN QUALITY:: ADEQUATE

## 2022-10-12 NOTE — Telephone Encounter (Signed)
Yes maybe [id number]

## 2022-10-12 NOTE — Progress Notes (Unsigned)
Office Visit Note   Patient: Hannah Brady           Date of Birth: 02-May-1961           MRN: [id number] Visit Date: 10/12/2022 Requested by: Steva Ready, DO [address] [place],  [place] [address] PCP: Steva Ready, DO  Subjective: Chief Complaint  Patient presents with   Right Elbow - Routine Post Op    HPI: Hannah Brady is a 62 y.o. female who presents to the office reporting right elbow olecranon bursitis.  Susceptible staph on culture.  We are doing every 48 hour packing change..                ROS: All systems reviewed are negative as they relate to the chief complaint within the history of present illness.  Patient denies fevers or chills.  Assessment & Plan: Visit Diagnoses:  1. Olecranon bursitis, right elbow     Plan: Plan is continue every 2 day packing change.  The space around the olecranon fossa is getting smaller.  Anticipate 3 more dressing changes give or take.  She is also taking amoxicillin.  That has improved her cellulitis and erythema around the elbow.  Follow-Up Instructions: No follow-ups on file.   Orders:  No orders of the defined types were placed in this encounter.  No orders of the defined types were placed in this encounter.     Procedures: No procedures performed   Clinical Data: No additional findings.  Objective: Vital Signs: There were no vitals taken for this visit.  Physical Exam:  Constitutional: Patient appears well-developed HEENT:  Head: Normocephalic Eyes:EOM are normal Neck: Normal range of motion Cardiovascular: Normal rate Pulmonary/chest: Effort normal Neurologic: Patient is alert Skin: Skin is warm Psychiatric: Patient has normal mood and affect  Ortho Exam: Ortho exam demonstrates diminished area around that right elbow olecranon bursa.  Induration and erythema is also decreased compared to last visit.  She has good strength in the hand.  Diminished swelling as  well.  Specialty Comments:  No specialty comments available.  Imaging: No results found.   PMFS History: Patient Active Problem List   Diagnosis Date Noted   Personal history of transient ischemic attack (TIA), and cerebral infarction without residual deficits 12/28/2021   Obesity 12/28/2021   Menopausal symptom 12/28/2021   Gastro-esophageal reflux disease without esophagitis 12/28/2021   Menopausal and female climacteric states 12/28/2021   Changing skin lesion 06/21/2021   Other hyperlipidemia 10/07/2020   Hyperlipemia 03/23/2016   TIA (transient ischemic attack) 06/15/2015   Elevated blood pressure 06/15/2015   Hyperglycemia 06/15/2015   Headache, common migraine 06/15/2015   Acute CVA (cerebrovascular accident) (HCC) 06/15/2015   Nonintractable common migraine    Left-sided weakness    Past Medical History:  Diagnosis Date   Complication of anesthesia    Hyperlipidemia    Mini stroke    Other fatigue    PONV (postoperative nausea and vomiting)    Stroke (HCC)    no defecits    Family History  Problem Relation Age of Onset   Liver disease Mother    Alcoholism Mother    Diabetes Father    Alcoholism Father    Breast cancer Neg Hx     Past Surgical History:  Procedure Laterality Date   BREAST EXCISIONAL BIOPSY Right    CESAREAN SECTION     CHONDROPLASTY Left 03/25/2016   Procedure: CHONDROPLASTY;  Surgeon: Marcene Corning, MD;  Location: [facility];  Service: Orthopedics;  Laterality: Left;   EP IMPLANTABLE DEVICE N/A 06/17/2015   Procedure: Loop Recorder Insertion;  Surgeon: Duke Salvia, MD;  Location: [facility] INVASIVE CV LAB;  Service: Cardiovascular;  Laterality: N/A;   KNEE ARTHROSCOPY Left 03/25/2016   Procedure: ARTHROSCOPY KNEE;  Surgeon: Marcene Corning, MD;  Location: [facility];  Service: Orthopedics;  Laterality: Left;   KNEE ARTHROSCOPY WITH MEDIAL MENISECTOMY Left 03/25/2016   Procedure: KNEE ARTHROSCOPY WITH MEDIAL  MENISECTOMY;  Surgeon: Marcene Corning, MD;  Location: [facility];  Service: Orthopedics;  Laterality: Left;   TEE WITHOUT CARDIOVERSION N/A 06/17/2015   Procedure: TRANSESOPHAGEAL ECHOCARDIOGRAM (TEE);  Surgeon: Chrystie Nose, MD;  Location: [facility];  Service: Cardiovascular;  Laterality: N/A;   Social History   Occupational History   Occupation: Administrator, 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

## 2022-10-12 NOTE — Telephone Encounter (Signed)
Patient advised to come back Friday please advise make space

## 2022-10-13 ENCOUNTER — Encounter: Payer: Self-pay | Admitting: Orthopedic Surgery

## 2022-10-13 NOTE — Telephone Encounter (Signed)
Lvm advising. Are you coming back to see her or is luke seeing her?

## 2022-10-13 NOTE — Telephone Encounter (Signed)
Scheduled

## 2022-10-14 ENCOUNTER — Ambulatory Visit: Payer: Commercial Managed Care - PPO | Admitting: Orthopedic Surgery

## 2022-10-14 ENCOUNTER — Encounter: Payer: Self-pay | Admitting: Orthopedic Surgery

## 2022-10-14 DIAGNOSIS — M7021 Olecranon bursitis, right elbow: Secondary | ICD-10-CM

## 2022-10-14 NOTE — Progress Notes (Signed)
Packing changed in the right elbow.  Space is becoming smaller.  Irrigated out with numbing medicine.  Plan to see her back on Monday for clinical recheck and packing change.  She will be finished by antibiotics by then.  Clinically doing okay but not really turning around as expected.  We will see how she is doing next week off antibiotics and that may point [place] either towards continue nonoperative treatment or olecranon bursectomy..  No fluctuance or significant swelling around that elbow.  Does have some tenderness around the tip.

## 2022-10-14 NOTE — Telephone Encounter (Signed)
We r here

## 2022-12-21 ENCOUNTER — Other Ambulatory Visit (HOSPITAL_COMMUNITY): Payer: Self-pay

## 2022-12-22 ENCOUNTER — Other Ambulatory Visit (HOSPITAL_COMMUNITY): Payer: Self-pay

## 2022-12-22 MED ORDER — ATORVASTATIN CALCIUM 40 MG PO TABS
40.0000 mg | ORAL_TABLET | Freq: Every day | ORAL | 5 refills | Status: AC
  Filled 2023-01-01: qty 90, 90d supply, fill #0

## 2023-01-01 ENCOUNTER — Other Ambulatory Visit (HOSPITAL_COMMUNITY): Payer: Self-pay

## 2023-01-02 ENCOUNTER — Other Ambulatory Visit (HOSPITAL_COMMUNITY): Payer: Self-pay

## 2023-01-30 DIAGNOSIS — R7303 Prediabetes: Secondary | ICD-10-CM | POA: Diagnosis not present

## 2023-01-30 DIAGNOSIS — E7800 Pure hypercholesterolemia, unspecified: Secondary | ICD-10-CM | POA: Diagnosis not present

## 2023-01-30 DIAGNOSIS — E669 Obesity, unspecified: Secondary | ICD-10-CM | POA: Diagnosis not present

## 2023-01-30 DIAGNOSIS — R232 Flushing: Secondary | ICD-10-CM | POA: Diagnosis not present

## 2023-01-30 DIAGNOSIS — Z0000 Encounter for general adult medical examination without abnormal findings: Secondary | ICD-10-CM | POA: Diagnosis not present

## 2023-01-30 DIAGNOSIS — Z8673 Personal history of transient ischemic attack (TIA), and cerebral infarction without residual deficits: Secondary | ICD-10-CM | POA: Diagnosis not present

## 2023-02-01 ENCOUNTER — Other Ambulatory Visit: Payer: Self-pay | Admitting: Internal Medicine

## 2023-02-01 DIAGNOSIS — Z1231 Encounter for screening mammogram for malignant neoplasm of breast: Secondary | ICD-10-CM

## 2023-02-22 ENCOUNTER — Ambulatory Visit
Admission: RE | Admit: 2023-02-22 | Discharge: 2023-02-22 | Disposition: A | Discharge status: Home / Self Care | Payer: Commercial Managed Care - PPO | Source: Ambulatory Visit | Attending: Internal Medicine | Admitting: Internal Medicine

## 2023-02-22 DIAGNOSIS — Z1231 Encounter for screening mammogram for malignant neoplasm of breast: Secondary | ICD-10-CM

## 2023-03-08 ENCOUNTER — Other Ambulatory Visit (HOSPITAL_COMMUNITY): Payer: Self-pay

## 2023-03-08 DIAGNOSIS — M25461 Effusion, right knee: Secondary | ICD-10-CM | POA: Diagnosis not present

## 2023-03-21 ENCOUNTER — Other Ambulatory Visit (HOSPITAL_COMMUNITY): Payer: Self-pay

## 2023-03-21 MED ORDER — ESCITALOPRAM OXALATE 10 MG PO TABS
5.0000 mg | ORAL_TABLET | Freq: Every day | ORAL | 3 refills | Status: AC
  Filled 2023-03-21: qty 45, 90d supply, fill #0

## 2023-03-22 ENCOUNTER — Other Ambulatory Visit (HOSPITAL_COMMUNITY): Payer: Self-pay

## 2023-03-22 MED ORDER — ATORVASTATIN CALCIUM 40 MG PO TABS
40.0000 mg | ORAL_TABLET | Freq: Every day | ORAL | 3 refills | Status: DC
  Filled 2023-03-22: qty 90, 90d supply, fill #0
  Filled 2023-07-10: qty 90, 90d supply, fill #1
  Filled 2023-10-08: qty 90, 90d supply, fill #2
  Filled 2024-01-07: qty 90, 90d supply, fill #3

## 2023-04-26 DIAGNOSIS — M25561 Pain in right knee: Secondary | ICD-10-CM | POA: Diagnosis not present

## 2023-05-08 DIAGNOSIS — M25561 Pain in right knee: Secondary | ICD-10-CM | POA: Diagnosis not present

## 2023-05-09 DIAGNOSIS — N951 Menopausal and female climacteric states: Secondary | ICD-10-CM | POA: Diagnosis not present

## 2023-05-09 DIAGNOSIS — Z01419 Encounter for gynecological examination (general) (routine) without abnormal findings: Secondary | ICD-10-CM | POA: Diagnosis not present

## 2023-05-09 DIAGNOSIS — Z124 Encounter for screening for malignant neoplasm of cervix: Secondary | ICD-10-CM | POA: Diagnosis not present

## 2023-05-09 DIAGNOSIS — Z8673 Personal history of transient ischemic attack (TIA), and cerebral infarction without residual deficits: Secondary | ICD-10-CM | POA: Diagnosis not present

## 2023-05-09 DIAGNOSIS — Z1151 Encounter for screening for human papillomavirus (HPV): Secondary | ICD-10-CM | POA: Diagnosis not present

## 2023-05-15 ENCOUNTER — Other Ambulatory Visit: Payer: Self-pay | Admitting: Orthopedic Surgery

## 2023-05-15 DIAGNOSIS — M25561 Pain in right knee: Secondary | ICD-10-CM | POA: Diagnosis not present

## 2023-05-19 ENCOUNTER — Encounter (HOSPITAL_COMMUNITY): Payer: Self-pay

## 2023-05-19 ENCOUNTER — Other Ambulatory Visit: Payer: Self-pay

## 2023-05-19 ENCOUNTER — Encounter (HOSPITAL_COMMUNITY)
Admission: RE | Admit: 2023-05-19 | Discharge: 2023-05-19 | Disposition: A | Discharge status: Home / Self Care | Payer: Commercial Managed Care - PPO | Source: Ambulatory Visit | Attending: Orthopedic Surgery

## 2023-05-19 ENCOUNTER — Other Ambulatory Visit (HOSPITAL_COMMUNITY): Payer: Commercial Managed Care - PPO

## 2023-05-19 VITALS — BP 136/78 | HR 75 | Temp 98.2°F | Resp 18 | Ht 64.0 in | Wt 186.0 lb

## 2023-05-19 DIAGNOSIS — Z01812 Encounter for preprocedural laboratory examination: Secondary | ICD-10-CM | POA: Insufficient documentation

## 2023-05-19 DIAGNOSIS — Z01818 Encounter for other preprocedural examination: Secondary | ICD-10-CM

## 2023-05-19 HISTORY — DX: Gastro-esophageal reflux disease without esophagitis: K21.9

## 2023-05-19 LAB — CBC
HCT: 42.7 % (ref 36.0–46.0)
Hemoglobin: 14.8 g/dL (ref 12.0–15.0)
MCH: 29.9 pg (ref 26.0–34.0)
MCHC: 34.7 g/dL (ref 30.0–36.0)
MCV: 86.3 fL (ref 80.0–100.0)
Platelets: 204 10*3/uL (ref 150–400)
RBC: 4.95 MIL/uL (ref 3.87–5.11)
RDW: 14 % (ref 11.5–15.5)
WBC: 5 10*3/uL (ref 4.0–10.5)
nRBC: 0 % (ref 0.0–0.2)

## 2023-05-19 NOTE — Progress Notes (Addendum)
Surgical Instructions   Your procedure is scheduled on Monday, December 30th, 2024. Report to [facility] Entrance  at 5:00 A.M., then check in with the Admitting office.    Remember:  Do not eat after midnight the night before your surgery   You may drink clear liquids until 4:30 the morning of your surgery.   Clear liquids allowed are: Water, Non-Citrus Juices (without pulp), Carbonated Beverages, Clear Tea (no milk, honey, etc.), Black Coffee Only (NO MILK, CREAM OR POWDERED CREAMER of any kind), and Gatorade.  Patient Instructions  The night before surgery:  No food after midnight. ONLY clear liquids after midnight  The day of surgery (if you do NOT have diabetes):  Drink ONE (1) Pre-Surgery Clear Ensure by 4:30 the morning of surgery. Drink in one sitting. Do not sip.  This drink was given to you during your hospital  pre-op appointment visit.  Nothing else to drink after completing the  Pre-Surgery Clear Ensure.     Take these medicines the morning of surgery with A SIP OF WATER: None.    May take these medicines IF NEEDED: None.     One week prior to surgery, STOP taking any Aspirin (unless otherwise instructed by your surgeon) Aleve, Naproxen, Ibuprofen, Motrin, Advil, Goody's, BC's, all herbal medications, fish oil, and non-prescription vitamins.                     Do NOT Smoke (Tobacco/Vaping) for 24 hours prior to your procedure.  If you use a CPAP at night, you may bring your mask/headgear for your overnight stay.   You will be asked to remove any contacts, glasses, piercing's, hearing aid's, dentures/partials prior to surgery. Please bring cases for these items if needed.    Patients discharged the day of surgery will not be allowed to drive home, and someone needs to stay with them for 24 hours.  SURGICAL WAITING ROOM VISITATION Patients may have no more than 2 support people in the waiting area - these visitors may rotate.   Pre-op nurse will  coordinate an appropriate time for 1 ADULT support person, who may not rotate, to accompany patient in pre-op.  Children under the age of 8 must have an adult with them who is not the patient and must remain in the main waiting area with an adult.  If the patient needs to stay at the hospital during part of their recovery, the visitor guidelines for inpatient rooms apply.  Please refer to the [facility] website for the visitor guidelines for any additional information.   If you received a COVID test during your pre-op visit  it is requested that you wear a mask when out in public, stay away from anyone that may not be feeling well and notify your surgeon if you develop symptoms. If you have been in contact with anyone that has tested positive in the last 10 days please notify you surgeon.      Pre-operative CHG Bathing Instructions   You can play a key role in reducing the risk of infection after surgery. Your skin needs to be as free of germs as possible. You can reduce the number of germs on your skin by washing with CHG (chlorhexidine gluconate) soap before surgery. CHG is an antiseptic soap that kills germs and continues to kill germs even after washing.   DO NOT use if you have an allergy to chlorhexidine/CHG or antibacterial soaps. If your skin becomes reddened or irritated, stop  using the CHG and notify one of our RNs at [phone].              TAKE A SHOWER THE NIGHT BEFORE SURGERY AND THE DAY OF SURGERY    Please keep in mind the following:  DO NOT shave, including legs and underarms, 48 hours prior to surgery.   You may shave your face before/day of surgery.  Place clean sheets on your bed the night before surgery Use a clean washcloth (not used since being washed) for each shower. DO NOT sleep with pet's night before surgery.  CHG Shower Instructions:  Wash your face and private area with normal soap. If you choose to wash your hair, wash first with your normal shampoo.   After you use shampoo/soap, rinse your hair and body thoroughly to remove shampoo/soap residue.  Turn the water OFF and apply half the bottle of CHG soap to a CLEAN washcloth.  Apply CHG soap ONLY FROM YOUR NECK DOWN TO YOUR TOES (washing for 3-5 minutes)  DO NOT use CHG soap on face, private areas, open wounds, or sores.  Pay special attention to the area where your surgery is being performed.  If you are having back surgery, having someone wash your back for you may be helpful. Wait 2 minutes after CHG soap is applied, then you may rinse off the CHG soap.  Pat dry with a clean towel  Put on clean pajamas    Additional instructions for the day of surgery: DO NOT APPLY any lotions, deodorants, cologne, or perfumes.   Do not wear jewelry or makeup Do not wear nail polish, gel polish, artificial nails, or any other type of covering on natural nails (fingers and toes) Do not bring valuables to the hospital. [facility] is not responsible for valuables/personal belongings. Put on clean/comfortable clothes.  Please brush your teeth.  Ask your nurse before applying any prescription medications to the skin.

## 2023-05-19 NOTE — Progress Notes (Signed)
Message sent to Hannah Brady for correction on orders, indication/reason for surgery is incorrect.

## 2023-05-19 NOTE — Progress Notes (Signed)
PCP - Dr. Hillard Danker Cardiologist - denies  PPM/ICD - denies Device Orders - denies Rep Notified - denies  Chest x-ray - denies EKG - denies Stress Test - denies ECHO - 2017 Cardiac Cath - denies  Sleep Study - denies CPAP - n/a  Fasting Blood Sugar - n/a   Last dose of GLP1 agonist-  n/a GLP1 instructions: n/a  Blood Thinner Instructions: n/a Aspirin Instructions: Per patient, Dr. Maryclare Labrador office instructed patient to continue 81 mg Aspirin, just not take DOS.   ERAS Protcol - clears until 0430 PRE-SURGERY Ensure - complete by 0430  COVID TEST- n/a   Anesthesia review: no  Patient denies shortness of breath, fever, cough and chest pain at PAT appointment   All instructions explained to the patient, with a verbal understanding of the material. Patient agrees to go over the instructions while at home for a better understanding. Patient also instructed to self quarantine after being tested for COVID-19. The opportunity to ask questions was provided.

## 2023-05-19 NOTE — Progress Notes (Signed)
COVID Vaccine Completed:  Yes  Date of COVID positive in last 90 days:  PCP -  Steva Ready, DO Cardiologist - Sherryl Manges, MD (last OV 2020 for stroke)  Chest x-ray - N/A EKG - N/A Stress Test - N/A ECHO - 06-17-15 Epic Cardiac Cath - N/A Pacemaker/ICD device last checked: Spinal Cord Stimulator:N/A Hx of Loop recorder (removed in 2020)  Bowel Prep - N/A  Sleep Study - N/A CPAP -   Fasting Blood Sugar - N/A Checks Blood Sugar _____ times a day  Last dose of GLP1 agonist-  N/A GLP1 instructions:  Hold 7 days before surgery    Last dose of SGLT-2 inhibitors-  N/A SGLT-2 instructions:  Hold 3 days before surgery    Blood Thinner Instructions:  Time Aspirin Instructions:  ASA 81 mg.  Patient was advised that she can stay on Last Dose:  Activity level:  Can go up a flight of stairs and perform activities of daily living without stopping and without symptoms of chest pain or shortness of breath.  Anesthesia review:  Hx of CVA with no residual deficits  Patient denies shortness of breath, fever, cough and chest pain at PAT appointment  Patient verbalized understanding of instructions that were given to them at the PAT appointment. Patient was also instructed that they will need to review over the PAT instructions again at home before surgery.

## 2023-05-22 ENCOUNTER — Other Ambulatory Visit: Payer: Self-pay

## 2023-05-22 ENCOUNTER — Ambulatory Visit (HOSPITAL_COMMUNITY): Payer: Commercial Managed Care - PPO | Admitting: Anesthesiology

## 2023-05-22 ENCOUNTER — Encounter (HOSPITAL_COMMUNITY): Payer: Self-pay | Admitting: Orthopedic Surgery

## 2023-05-22 ENCOUNTER — Other Ambulatory Visit (HOSPITAL_COMMUNITY): Payer: Self-pay

## 2023-05-22 ENCOUNTER — Ambulatory Visit (HOSPITAL_COMMUNITY)
Admission: RE | Admit: 2023-05-22 | Discharge: 2023-05-22 | Disposition: A | Discharge status: Home / Self Care | Payer: Commercial Managed Care - PPO | Attending: Orthopedic Surgery | Admitting: Orthopedic Surgery

## 2023-05-22 ENCOUNTER — Ambulatory Visit (HOSPITAL_BASED_OUTPATIENT_CLINIC_OR_DEPARTMENT_OTHER): Payer: Commercial Managed Care - PPO | Admitting: Anesthesiology

## 2023-05-22 ENCOUNTER — Encounter (HOSPITAL_COMMUNITY)
Admission: RE | Disposition: A | Discharge status: Home / Self Care | Payer: Self-pay | Source: Home / Self Care | Attending: Orthopedic Surgery

## 2023-05-22 DIAGNOSIS — Z8673 Personal history of transient ischemic attack (TIA), and cerebral infarction without residual deficits: Secondary | ICD-10-CM | POA: Diagnosis not present

## 2023-05-22 DIAGNOSIS — E785 Hyperlipidemia, unspecified: Secondary | ICD-10-CM | POA: Diagnosis not present

## 2023-05-22 DIAGNOSIS — S83241A Other tear of medial meniscus, current injury, right knee, initial encounter: Secondary | ICD-10-CM | POA: Diagnosis not present

## 2023-05-22 DIAGNOSIS — X58XXXA Exposure to other specified factors, initial encounter: Secondary | ICD-10-CM | POA: Diagnosis not present

## 2023-05-22 DIAGNOSIS — M1711 Unilateral primary osteoarthritis, right knee: Secondary | ICD-10-CM | POA: Diagnosis not present

## 2023-05-22 DIAGNOSIS — G459 Transient cerebral ischemic attack, unspecified: Secondary | ICD-10-CM

## 2023-05-22 DIAGNOSIS — M2341 Loose body in knee, right knee: Secondary | ICD-10-CM | POA: Insufficient documentation

## 2023-05-22 DIAGNOSIS — M2241 Chondromalacia patellae, right knee: Secondary | ICD-10-CM | POA: Insufficient documentation

## 2023-05-22 DIAGNOSIS — S83231A Complex tear of medial meniscus, current injury, right knee, initial encounter: Secondary | ICD-10-CM | POA: Diagnosis not present

## 2023-05-22 DIAGNOSIS — E7849 Other hyperlipidemia: Secondary | ICD-10-CM

## 2023-05-22 HISTORY — PX: KNEE ARTHROSCOPY WITH MEDIAL MENISECTOMY: SHX5651

## 2023-05-22 SURGERY — ARTHROSCOPY, KNEE, WITH MEDIAL MENISCECTOMY
Anesthesia: General | Site: Knee | Laterality: Right

## 2023-05-22 MED ORDER — DEXAMETHASONE SODIUM PHOSPHATE 10 MG/ML IJ SOLN
INTRAMUSCULAR | Status: DC | PRN
  Administered 2023-05-22: 10 mg via INTRAVENOUS

## 2023-05-22 MED ORDER — DROPERIDOL 2.5 MG/ML IJ SOLN
INTRAMUSCULAR | Status: DC | PRN
  Administered 2023-05-22: .625 mg via INTRAVENOUS

## 2023-05-22 MED ORDER — ONDANSETRON HCL 4 MG/2ML IJ SOLN
INTRAMUSCULAR | Status: DC | PRN
  Administered 2023-05-22: 4 mg via INTRAVENOUS

## 2023-05-22 MED ORDER — PROPOFOL 10 MG/ML IV BOLUS
INTRAVENOUS | Status: DC | PRN
  Administered 2023-05-22: 200 mg via INTRAVENOUS

## 2023-05-22 MED ORDER — ONDANSETRON HCL 4 MG PO TABS
4.0000 mg | ORAL_TABLET | Freq: Three times a day (TID) | ORAL | 0 refills | Status: AC | PRN
  Filled 2023-05-22: qty 20, 7d supply, fill #0

## 2023-05-22 MED ORDER — SCOPOLAMINE 1 MG/3DAYS TD PT72: 1.0000 | MEDICATED_PATCH | TRANSDERMAL | Status: DC

## 2023-05-22 MED ORDER — BUPIVACAINE-EPINEPHRINE 0.25% -1:200000 IJ SOLN
INTRAMUSCULAR | Status: AC
Start: 2023-05-22 — End: ?
  Filled 2023-05-22: qty 1

## 2023-05-22 MED ORDER — PROPOFOL 500 MG/50ML IV EMUL
INTRAVENOUS | Status: AC
  Filled 2023-05-22: qty 50

## 2023-05-22 MED ORDER — LIDOCAINE HCL (PF) 2 % IJ SOLN
INTRAMUSCULAR | Status: AC
  Filled 2023-05-22: qty 5

## 2023-05-22 MED ORDER — LACTATED RINGERS IV SOLN: INTRAVENOUS | Status: DC

## 2023-05-22 MED ORDER — EPINEPHRINE PF 1 MG/ML IJ SOLN
INTRAMUSCULAR | Status: AC
  Filled 2023-05-22: qty 1

## 2023-05-22 MED ORDER — DEXAMETHASONE SODIUM PHOSPHATE 10 MG/ML IJ SOLN
INTRAMUSCULAR | Status: AC
  Filled 2023-05-22: qty 1

## 2023-05-22 MED ORDER — PROPOFOL 1000 MG/100ML IV EMUL
INTRAVENOUS | Status: AC
  Filled 2023-05-22: qty 100

## 2023-05-22 MED ORDER — EPHEDRINE SULFATE (PRESSORS) 50 MG/ML IJ SOLN
INTRAMUSCULAR | Status: DC | PRN
  Administered 2023-05-22 (×2): 5 mg via INTRAVENOUS

## 2023-05-22 MED ORDER — SCOPOLAMINE 1 MG/3DAYS TD PT72
MEDICATED_PATCH | TRANSDERMAL | Status: AC
  Filled 2023-05-22: qty 1

## 2023-05-22 MED ORDER — MIDAZOLAM HCL 5 MG/5ML IJ SOLN
INTRAMUSCULAR | Status: DC | PRN
  Administered 2023-05-22: 2 mg via INTRAVENOUS

## 2023-05-22 MED ORDER — TRAMADOL HCL 50 MG PO TABS
50.0000 mg | ORAL_TABLET | Freq: Four times a day (QID) | ORAL | 0 refills | Status: AC | PRN
  Filled 2023-05-22: qty 20, 5d supply, fill #0

## 2023-05-22 MED ORDER — FENTANYL CITRATE (PF) 100 MCG/2ML IJ SOLN
INTRAMUSCULAR | Status: AC
  Filled 2023-05-22: qty 2

## 2023-05-22 MED ORDER — FENTANYL CITRATE (PF) 100 MCG/2ML IJ SOLN
INTRAMUSCULAR | Status: DC | PRN
  Administered 2023-05-22 (×2): 25 ug via INTRAVENOUS
  Administered 2023-05-22: 50 ug via INTRAVENOUS

## 2023-05-22 MED ORDER — LIDOCAINE HCL 1 % IJ SOLN
INTRAMUSCULAR | Status: DC | PRN
  Administered 2023-05-22: 100 mg via INTRADERMAL

## 2023-05-22 MED ORDER — ONDANSETRON HCL 4 MG/2ML IJ SOLN: 4.0000 mg | Freq: Once | INTRAMUSCULAR | Status: DC | PRN

## 2023-05-22 MED ORDER — OXYCODONE HCL 5 MG/5ML PO SOLN
5.0000 mg | Freq: Once | ORAL | Status: AC | PRN
Start: 2023-05-22 — End: 2023-05-22

## 2023-05-22 MED ORDER — ONDANSETRON HCL 4 MG/2ML IJ SOLN
INTRAMUSCULAR | Status: AC
  Filled 2023-05-22: qty 2

## 2023-05-22 MED ORDER — KETOROLAC TROMETHAMINE 30 MG/ML IJ SOLN: 30.0000 mg | Freq: Once | INTRAMUSCULAR | Status: DC | PRN

## 2023-05-22 MED ORDER — PHENYLEPHRINE HCL-NACL 20-0.9 MG/250ML-% IV SOLN
INTRAVENOUS | Status: AC
  Filled 2023-05-22: qty 500

## 2023-05-22 MED ORDER — CEFAZOLIN SODIUM-DEXTROSE 2-4 GM/100ML-% IV SOLN
2.0000 g | INTRAVENOUS | Status: AC
  Administered 2023-05-22: 2 g via INTRAVENOUS
  Filled 2023-05-22: qty 100

## 2023-05-22 MED ORDER — FENTANYL CITRATE PF 50 MCG/ML IJ SOSY: 25.0000 ug | PREFILLED_SYRINGE | INTRAMUSCULAR | Status: DC | PRN

## 2023-05-22 MED ORDER — MIDAZOLAM HCL 2 MG/2ML IJ SOLN
INTRAMUSCULAR | Status: AC
Start: 2023-05-22 — End: ?
  Filled 2023-05-22: qty 2

## 2023-05-22 MED ORDER — ORAL CARE MOUTH RINSE: 15.0000 mL | Freq: Once | OROMUCOSAL | Status: AC

## 2023-05-22 MED ORDER — SODIUM CHLORIDE 0.9 % IR SOLN
Status: DC | PRN
  Administered 2023-05-22 (×2): 3000 mL

## 2023-05-22 MED ORDER — OXYCODONE HCL 5 MG PO TABS: 5.0000 mg | ORAL_TABLET | Freq: Once | ORAL | Status: AC | PRN

## 2023-05-22 MED ORDER — BUPIVACAINE-EPINEPHRINE (PF) 0.5% -1:200000 IJ SOLN
INTRAMUSCULAR | Status: AC
  Filled 2023-05-22: qty 30

## 2023-05-22 MED ORDER — BUPIVACAINE-EPINEPHRINE 0.25% -1:200000 IJ SOLN
INTRAMUSCULAR | Status: DC | PRN
  Administered 2023-05-22: 20 mL

## 2023-05-22 MED ORDER — CHLORHEXIDINE GLUCONATE 0.12 % MT SOLN
15.0000 mL | Freq: Once | OROMUCOSAL | Status: AC
  Administered 2023-05-22: 15 mL via OROMUCOSAL

## 2023-05-22 MED ORDER — EPHEDRINE 5 MG/ML INJ
INTRAVENOUS | Status: AC
  Filled 2023-05-22: qty 5

## 2023-05-22 MED ORDER — SCOPOLAMINE 1 MG/3DAYS TD PT72
1.0000 | MEDICATED_PATCH | TRANSDERMAL | Status: DC
  Administered 2023-05-22: 1.5 mg via TRANSDERMAL

## 2023-05-22 MED ORDER — OXYCODONE HCL 5 MG PO TABS
ORAL_TABLET | ORAL | Status: AC
  Administered 2023-05-22: 5 mg via ORAL
  Filled 2023-05-22: qty 1

## 2023-05-22 SURGICAL SUPPLY — 34 items
BAG COUNTER SPONGE SURGICOUNT (BAG) IMPLANT
BANDAGE ESMARK 6X9 LF (GAUZE/BANDAGES/DRESSINGS) ×2 IMPLANT
BLADE EXCALIBUR 4.0X13 (MISCELLANEOUS) IMPLANT
BNDG ELASTIC 6INX 5YD STR LF (GAUZE/BANDAGES/DRESSINGS) ×2 IMPLANT
BNDG ELASTIC 6X10 VLCR STRL LF (GAUZE/BANDAGES/DRESSINGS) IMPLANT
BNDG ESMARK 6X9 LF (GAUZE/BANDAGES/DRESSINGS) ×1 IMPLANT
CHLORAPREP W/TINT 26 (MISCELLANEOUS) ×2 IMPLANT
DISSECTOR 3.8MM X 13CM (MISCELLANEOUS) ×2 IMPLANT
DRAPE SHEET LG 3/4 BI-LAMINATE (DRAPES) ×2 IMPLANT
DRAPE U-SHAPE 47X51 STRL (DRAPES) ×2 IMPLANT
DRSG EMULSION OIL 3X3 NADH (GAUZE/BANDAGES/DRESSINGS) ×2 IMPLANT
ELECT MENISCUS 165MM 90D (ELECTRODE) IMPLANT
ELECT REM PT RETURN 15FT ADLT (MISCELLANEOUS) IMPLANT
GAUZE PAD ABD 8X10 STRL (GAUZE/BANDAGES/DRESSINGS) ×2 IMPLANT
GAUZE SPONGE 4X4 12PLY STRL (GAUZE/BANDAGES/DRESSINGS) ×2 IMPLANT
GLOVE BIO SURGEON STRL SZ7.5 (GLOVE) ×2 IMPLANT
GLOVE BIOGEL PI IND STRL 8 (GLOVE) ×4 IMPLANT
GOWN SPEC L4 XLG W/TWL (GOWN DISPOSABLE) ×2 IMPLANT
IV NS IRRIG 3000ML ARTHROMATIC (IV SOLUTION) IMPLANT
KIT BASIN OR (CUSTOM PROCEDURE TRAY) ×2 IMPLANT
KIT TURNOVER KIT A (KITS) IMPLANT
MANIFOLD NEPTUNE II (INSTRUMENTS) IMPLANT
PACK ARTHROSCOPY DSU (CUSTOM PROCEDURE TRAY) ×2 IMPLANT
PACK ARTHROSCOPY WL (CUSTOM PROCEDURE TRAY) IMPLANT
PADDING CAST COTTON 6X4 STRL (CAST SUPPLIES) ×2 IMPLANT
PENCIL SMOKE EVACUATOR (MISCELLANEOUS) IMPLANT
SUT ETHILON 3 0 PS 1 (SUTURE) ×2 IMPLANT
SYR 3ML 18GX1 1/2 (SYRINGE) IMPLANT
TOWEL OR 17X26 10 PK STRL BLUE (TOWEL DISPOSABLE) ×2 IMPLANT
TUBING ARTHROSCOPY IRRIG 16FT (MISCELLANEOUS) ×2 IMPLANT
WAND ABLATOR APOLLO I90 (BUR) IMPLANT
WAND APOLLORF SJ50 AR-9845 (SURGICAL WAND) IMPLANT
WATER STERILE IRR 1000ML POUR (IV SOLUTION) ×2 IMPLANT
WRAP KNEE MAXI GEL POST OP (GAUZE/BANDAGES/DRESSINGS) ×2 IMPLANT

## 2023-05-22 NOTE — Progress Notes (Signed)
Orthopedic Tech Progress Note Patient Details:  Hannah Brady 05/13/1961 [id number]  Ortho Devices Type of Ortho Device: Crutches Ortho Device/Splint Interventions: Ordered, Application, Adjustment      Darleen Crocker 05/22/2023, 9:32 AM

## 2023-05-22 NOTE — H&P (Signed)
Hannah Brady is an 62 y.o. female.   Chief Complaint: Right knee pain HPI: 62 year old female with right knee pain. She was complaining of symptoms of catching/locking and her knee giving out. Her MRI demonstrated a medial meniscus tear  Past Medical History:  Diagnosis Date   Acid reflux    Complication of anesthesia    Hyperlipidemia    Mini stroke    Other fatigue    PONV (postoperative nausea and vomiting)    Stroke (HCC)    no defecits    Past Surgical History:  Procedure Laterality Date   BREAST EXCISIONAL BIOPSY Right    CESAREAN SECTION     CHONDROPLASTY Left 03/25/2016   Procedure: CHONDROPLASTY;  Surgeon: Marcene Corning, MD;  Location: [facility];  Service: Orthopedics;  Laterality: Left;   COLONOSCOPY     EP IMPLANTABLE DEVICE N/A 06/17/2015   Procedure: Loop Recorder Insertion;  Surgeon: Duke Salvia, MD;  Location: [facility] INVASIVE CV LAB;  Service: Cardiovascular;  Laterality: N/A;   KNEE ARTHROSCOPY Left 03/25/2016   Procedure: ARTHROSCOPY KNEE;  Surgeon: Marcene Corning, MD;  Location: [facility];  Service: Orthopedics;  Laterality: Left;   KNEE ARTHROSCOPY WITH MEDIAL MENISECTOMY Left 03/25/2016   Procedure: KNEE ARTHROSCOPY WITH MEDIAL MENISECTOMY;  Surgeon: Marcene Corning, MD;  Location: [facility];  Service: Orthopedics;  Laterality: Left;   TEE WITHOUT CARDIOVERSION N/A 06/17/2015   Procedure: TRANSESOPHAGEAL ECHOCARDIOGRAM (TEE);  Surgeon: Chrystie Nose, MD;  Location: [facility] ENDOSCOPY;  Service: Cardiovascular;  Laterality: N/A;    Family History  Problem Relation Age of Onset   Liver disease Mother    Alcoholism Mother    Diabetes Father    Alcoholism Father    Breast cancer Neg Hx    Social History:  reports that she has never smoked. She has never been exposed to tobacco smoke. She has never used smokeless tobacco. She reports current alcohol use. She reports that she does not use  drugs.  Allergies: No Known Allergies  Medications Prior to Admission  Medication Sig Dispense Refill   ascorbic acid (VITAMIN C) 500 MG tablet Take 500 mg by mouth daily.     aspirin EC 81 MG tablet Take 1 tablet (81 mg total) by mouth daily. 90 tablet 3   atorvastatin (LIPITOR) 40 MG tablet Take 1 tablet (40 mg total) by mouth once  daily at 6 pm. 90 tablet 5   atorvastatin (LIPITOR) 40 MG tablet Take 1 tablet (40 mg total) by mouth once daily at 6 PM. 90 tablet 3   Biotin 81191 MCG TABS Take 1 tablet by mouth daily.     Cholecalciferol (VITAMIN D) 125 MCG (5000 UT) CAPS Take 1 capsule by mouth daily.     Cinnamon 500 MG TABS Take 500 mg by mouth daily.     Melatonin 1 MG CHEW Chew 1 mg by mouth at bedtime as needed (sleep).     Multiple Vitamin (MULTIVITAMIN WITH MINERALS) TABS tablet Take 1 tablet by mouth daily.     zinc gluconate 50 MG tablet Take 50 mg by mouth daily.     albuterol (VENTOLIN HFA) 108 (90 Base) MCG/ACT inhaler Inhale 2 puffs into the lungs every 4 (four) hours as needed for 10 days (Patient not taking: Reported on 05/19/2023) 6.7 g 0   escitalopram (LEXAPRO) 10 MG tablet Take 2 tablets (20 mg total) by mouth daily- can decrease to 1 tab daily as needed (Patient not  taking: Reported on 05/19/2023) 180 tablet 2   escitalopram (LEXAPRO) 10 MG tablet Take 1/2 tablet (5 mg total) by mouth daily. (Patient not taking: Reported on 05/19/2023) 45 tablet 3   loratadine (CLARITIN) 10 MG tablet Take 1 tablet (10 mg total) by mouth daily. (Patient taking differently: Take 10 mg by mouth daily as needed for allergies.) 100 tablet 0    No results found for this or any previous visit (from the past 48 hours). No results found.  Review of Systems  Constitutional: Negative.   HENT: Negative.    Respiratory: Negative.    Cardiovascular: Negative.   Gastrointestinal: Negative.   Genitourinary: Negative.   Musculoskeletal:  Positive for arthralgias and gait problem.   Psychiatric/Behavioral: Negative.      Blood pressure 126/81, pulse 75, temperature 98.2 F (36.8 C), temperature source Oral, resp. rate 11, height 5\' 4"  (1.626 m), weight 84.4 kg, SpO2 97%. Physical Exam Constitutional:      Appearance: Normal appearance.  HENT:     Head: Normocephalic and atraumatic.     Mouth/Throat:     Mouth: Mucous membranes are moist.  Eyes:     Pupils: Pupils are equal, round, and reactive to light.  Cardiovascular:     Rate and Rhythm: Normal rate and regular rhythm.     Pulses: Normal pulses.     Heart sounds: Normal heart sounds.  Pulmonary:     Effort: Pulmonary effort is normal.     Breath sounds: Normal breath sounds.  Abdominal:     General: Abdomen is flat.     Palpations: Abdomen is soft.  Musculoskeletal:     Comments: Right knee Mild effusion Medial joint line tenderness + McMurrays  Skin:    Capillary Refill: Capillary refill takes less than 2 seconds.  Neurological:     General: No focal deficit present.     Mental Status: She is alert and oriented to person, place, and time.  Psychiatric:        Mood and Affect: Mood normal.        Behavior: Behavior normal.      Assessment/Plan 62 year old female with a right medial meniscus tear mild osteoarthritis  Plan for right knee arthroscopy with partial medial menisectomy and chondroplasty.  Risks/Benefits of surgery discussed and all questions answered. These risks include but are not limited to infection, bleeding, damage to surrounding structures, DVT, need for additional surgery, stiffness and cardiopulmonary complications. She also understands the success of the procedure also depends on the amount of arthritis found at the time of surgery. She understands all this and wishes to proceed with surgery.  Luci Bank, MD 05/22/2023, 6:48 AM

## 2023-05-22 NOTE — Anesthesia Procedure Notes (Signed)
Procedure Name: LMA Insertion Date/Time: 05/22/2023 7:31 AM  Performed by: Floydene Flock, CRNAPre-anesthesia Checklist: Patient identified, Emergency Drugs available, Suction available and Patient being monitored Patient Re-evaluated:Patient Re-evaluated prior to induction Oxygen Delivery Method: Circle system utilized Preoxygenation: Pre-oxygenation with 100% oxygen Induction Type: IV induction LMA: LMA inserted LMA Size: 4.0 Number of attempts: 1 Placement Confirmation: positive ETCO2 and breath sounds checked- equal and bilateral Tube secured with: Tape Dental Injury: Teeth and Oropharynx as per pre-operative assessment

## 2023-05-22 NOTE — Anesthesia Postprocedure Evaluation (Signed)
Anesthesia Post Note  Patient: Hannah Brady  Procedure(s) Performed: KNEE ARTHROSCOPY WITH PARTIAL MEDIAL MENISECTOMY, CHONDROPLASTY, AND REMOVAL OF LOOSE BODY (Right: Knee)     Patient location during evaluation: PACU Anesthesia Type: General Level of consciousness: awake and alert Pain management: pain level controlled Vital Signs Assessment: post-procedure vital signs reviewed and stable Respiratory status: spontaneous breathing, nonlabored ventilation, respiratory function stable and patient connected to nasal cannula oxygen Cardiovascular status: blood pressure returned to baseline and stable Postop Assessment: no apparent nausea or vomiting Anesthetic complications: no  No notable events documented.  Last Vitals:  Vitals:   05/22/23 0845 05/22/23 0900  BP: 128/86 136/85  Pulse: 81 85  Resp: 12 15  Temp:    SpO2: 100% 97%    Last Pain:  Vitals:   05/22/23 0834  TempSrc:   PainSc: Asleep                 Romario Tith S

## 2023-05-22 NOTE — Discharge Instructions (Addendum)
Thornell Mule, MD [facility] [address], [place], [place] [address] [phone]   POST-OPERATIVE INSTRUCTIONS - Knee Arthroscopy  WOUND CARE - You may remove the Operative Dressing on Post-Op Day #3 (72hrs after surgery).   -  Alternatively if you would like you can leave dressing on until follow-up if within 7-8 days but keep it dry. - Leave steri-strips in place until they fall off on their own, usually 2 weeks postop. - An ACE wrap may be used to control swelling, do not wrap this too tight.  If the initial ACE wrap feels too tight you may loosen it. - There may be a small amount of fluid/bleeding leaking at the surgical site.  - This is normal; the knee is filled with fluid during the procedure and can leak for 24-48hrs after surgery. You may change/reinforce the bandage as needed.  - Use the Cryocuff or Ice as often as possible for the first 7 days, then as needed for pain relief. Always keep a towel, ACE wrap or other barrier between the cooling unit and your skin.  - You may shower on Post-Op Day #3. Gently pat the area dry.  - Do not soak the knee in water or submerge it.  - Do not go swimming in the pool or ocean until 4 weeks after surgery or when otherwise instructed.  Keep dry incisions as dry as possible.   BRACE/AMBULATION  -           You will not need a brace after this procedure.   - You may use crutches initially to help you weight bear, but this is not required - You can put full weight on your operative leg as you feel comfortable  PHYSICAL THERAPY - You will begin physical therapy soon after surgery (unless otherwise specified) - Please call to set up an appointment, if you do not already have one  - Let our office if there are any issues with scheduling your therapy   REGIONAL ANESTHESIA (NERVE BLOCKS) The anesthesia team may have performed a nerve block for you this is a great tool used to minimize pain.   The block may start  wearing off overnight (between 8-24 hours postop) When the block wears off, your pain may go from nearly zero to the pain you would have had postop without the block. This is an abrupt transition but nothing dangerous is happening.   This can be a challenging period but utilize your as needed pain medications to try and manage this period. We suggest you use the pain medication the first night prior to going to bed, to ease this transition.  You may take an extra dose of narcotic when this happens if needed   POST-OP MEDICATIONS- Multimodal approach to pain control In general your pain will be controlled with a combination of substances.  Prescriptions unless otherwise discussed are electronically sent to your pharmacy.  This is a carefully made plan we use to minimize narcotic use.     Ibuprofen or Naproxen - Anti-inflammatory medication taken on a scheduled basis Acetaminophen - Non-narcotic pain medicine taken on a scheduled basis  Tramadol - This is a strong narcotic, to be used only on an "as needed" basis for SEVERE pain. Zofran - take as needed for nausea  FOLLOW-UP   Please call the office to schedule a follow-up appointment for your incision check, 7-10 days post-operatively.   IF YOU HAVE ANY QUESTIONS, PLEASE FEEL FREE TO CALL OUR  OFFICE.   HELPFUL INFORMATION   Keep your leg elevated to decrease swelling, which will then in turn decrease your pain. I would elevate the foot of your bed by putting a couple of couch pillows between your mattress and box spring. I would not keep pillow directly under your ankle.  - Do not sleep with a pillow behind your knee even if it is more comfortable as this may make it harder to get your knee fully straight long term.   There will be MORE swelling on days 1-3 than there is on the day of surgery.  This also is normal. The swelling will decrease with the anti-inflammatory medication, ice and keeping it elevated. The swelling will make it  more difficult to bend your knee. As the swelling goes down your motion will become easier   You may develop swelling and bruising that extends from your knee down to your calf and perhaps even to your foot over the next week. Do not be alarmed. This too is normal, and it is due to gravity   There may be some numbness adjacent to the incision site. This may last for 6-12 months or longer in some patients and is expected.   You may return to sedentary work/school in the next couple of days when you feel up to it. You will need to keep your leg elevated as much as possible    You should wean off your narcotic medicines as soon as you are able.  Most patients will be off narcotics before their first postop appointment.    We suggest you use the pain medication the first night prior to going to bed, in order to ease any pain when the anesthesia wears off. You should avoid taking pain medications on an empty stomach as it will make you nauseous.   Do not drink alcoholic beverages or take illicit drugs when taking pain medications.   It is against the law to drive while taking narcotics. You cannot drive if your Right leg is in brace locked in extension.   Pain medication may make you constipated.  Below are a few solutions to try in this order:  o Decrease the amount of pain medication if you aren't having pain.  o Drink lots of decaffeinated fluids.  o Drink prune juice and/or eat dried prunes   o If the first 3 don't work start with additional solutions  o Take Colace - an over-the-counter stool softener  o Take Senokot - an over-the-counter laxative  o Take Miralax - a stronger over-the-counter laxative

## 2023-05-22 NOTE — Transfer of Care (Signed)
Immediate Anesthesia Transfer of Care Note  Patient: Hannah Brady  Procedure(s) Performed: KNEE ARTHROSCOPY WITH PARTIAL MEDIAL MENISECTOMY, CHONDROPLASTY, AND REMOVAL OF LOOSE BODY (Right: Knee)  Patient Location: PACU  Anesthesia Type:General  Level of Consciousness: drowsy  Airway & Oxygen Therapy: Patient Spontanous Breathing and Patient connected to face mask oxygen  Post-op Assessment: Report given to RN and Post -op Vital signs reviewed and stable  Post vital signs: Reviewed and stable  Last Vitals:  Vitals Value Taken Time  BP 131/75 05/22/23 0834  Temp    Pulse 92 05/22/23 0836  Resp 15 05/22/23 0836  SpO2 100 % 05/22/23 0836  Vitals shown include unfiled device data.  Last Pain:  Vitals:   05/22/23 0556  TempSrc:   PainSc: 0-No pain      Patients Stated Pain Goal: 5 (05/22/23 0556)  Complications: No notable events documented.

## 2023-05-22 NOTE — Anesthesia Preprocedure Evaluation (Signed)
Anesthesia Evaluation  Patient identified by MRN, date of birth, ID band Patient awake    Reviewed: Allergy & Precautions, H&P , NPO status , Patient's Chart, lab work & pertinent test results  Airway Mallampati: II  TM Distance: >3 FB Neck ROM: Full    Dental no notable dental hx.    Pulmonary neg pulmonary ROS   Pulmonary exam normal breath sounds clear to auscultation       Cardiovascular negative cardio ROS Normal cardiovascular exam Rhythm:Regular Rate:Normal     Neuro/Psych TIACVA  negative psych ROS   GI/Hepatic negative GI ROS, Neg liver ROS,,,  Endo/Other  negative endocrine ROS    Renal/GU negative Renal ROS  negative genitourinary   Musculoskeletal negative musculoskeletal ROS (+)    Abdominal   Peds negative pediatric ROS (+)  Hematology negative hematology ROS (+)   Anesthesia Other Findings   Reproductive/Obstetrics negative OB ROS                             Anesthesia Physical Anesthesia Plan  ASA: 3  Anesthesia Plan: General   Post-op Pain Management: Minimal or no pain anticipated   Induction: Intravenous  PONV Risk Score and Plan: 3 and Ondansetron, Dexamethasone and Treatment may vary due to age or medical condition  Airway Management Planned: LMA  Additional Equipment:   Intra-op Plan:   Post-operative Plan: Extubation in OR  Informed Consent: I have reviewed the patients History and Physical, chart, labs and discussed the procedure including the risks, benefits and alternatives for the proposed anesthesia with the patient or authorized representative who has indicated his/her understanding and acceptance.     Dental advisory given  Plan Discussed with: CRNA and Surgeon  Anesthesia Plan Comments:        Anesthesia Quick Evaluation

## 2023-05-22 NOTE — Op Note (Signed)
.Orthopaedic Surgery Operative Note (CSN: [id number])  Hannah Brady  1960/06/04 Date of Surgery: 05/22/2023   Diagnoses:  RIGHT KNEE MEDIAL MENISCAL TEAR; OSTEOARTHRITIS  Procedure: Right knee arthroscopy with partial medial meniscectomy, chondroplasty and removal of loose body   Post-operative plan: The patient will be WBAT.  DVT prophylaxis Aspirin 81 mg twice daily for 4 weeks.   Pain control with PRN pain medication preferring oral medicines.  Follow up plan will be scheduled in approximately 14 days for incision check.  Post-Op Diagnosis: Same Surgeons:Primary: Hannah Bank, MD Assistants:NONE Location: [facility] ROOM 04 Anesthesia: General Antibiotics: Ancef 2 g Tourniquet time:  Total Tourniquet Time Documented: Thigh (Right) - 28 minutes Total: Thigh (Right) - 28 minutes  Estimated Blood Loss: Minimal Complications: None Specimens: None Implants: * No implants in log *  Indications for Surgery:   Hannah Brady is a 62 y.o. female with right knee pain.  She has had progressively worsening knee pain for several months.  She experiences some episodes of catching/locking as well as her knee giving way.  Her MRI demonstrated a medial meniscus tear and mild early osteoarthritis.  She was indicated for a right knee arthroscopy with partial medial meniscectomy and chondroplasty  Risks/Benefits of surgery discussed and all questions answered. These risks include but are not limited to infection, bleeding, damage to surrounding structures, DVT, need for additional surgery, stiffness and cardiopulmonary complications. She also understands the success of the procedure also depends on the amount of arthritis found at the time of surgery. She understands all this and wishes to proceed with surgery.   Exam under anesthesia: Range of motion full and symmetric to opposite knee, ligamentously stable exam with normal Lachmann  Arthroscopic Findings: Suprapatellar pouch:  There was grade III chondromalacia of the patella with a fairly sizable large area of grade 4 chondral defect of the central patella.  Grade 2 chondral ranges of the trochlea  Medial compartment: There is a large complex meniscus tear of the posterior horn and body.  There was diffuse grade 2-3 changes of the medial femoral condyle chondral surface.  There is diffuse grade 1 and 2 chondral changes of the medial tibial plateau.  There is a large loose chondral body within the medial compartment.  Lateral Compartment: There was free edge fraying of the lateral meniscus.  There was diffuse grade 1 chondral changes of the dural femoral condyle and lateral tibial plateau with a small area of grade 3 change on the lateral femoral condyle  Intercondylar [place]: ACL and PCL were intact  Procedure:   The patient was identified properly. Informed consent was obtained and the surgical site was marked. The patient was taken up to suite where general anesthesia was induced. The patient was placed in the supine position with a leg holder for the surgical leg and a nonsterile tourniquet applied. The surgical leg was then prepped and draped usual sterile fashion.  A standard surgical timeout was performed.  2 standard anterior portals were made and diagnostic arthroscopy performed. Please note the findings as noted above.  We started in the lateral compartment.  A shaver was used to perform a gentle chondroplasty back to a stable chondral base and to clean up the edges of the lateral meniscus.  We then turned our attention to the medial compartment.  A grasper was used to remove the loose body. We performed a medial meniscectomy using a shaver and basket back to a stable base removing all loose fragments.  A gentle  chondroplasty was performed with a shaver back to a stable chondral base.  Then turned her attention to the patellofemoral compartment.  A gentle chondroplasty was performed of the patella to a stable chondral  base  Incisions closed with nylon suture and the knee injected with 20cc of 0.25% Marcaine. The patient was awoken from general anesthesia and taken to the PACU in stable condition without complication.    Hannah Brady 05/22/2023, 8:51 AM Orthopaedic Surgery

## 2023-05-23 ENCOUNTER — Encounter (HOSPITAL_COMMUNITY): Payer: Self-pay | Admitting: Orthopedic Surgery

## 2023-06-08 DIAGNOSIS — R531 Weakness: Secondary | ICD-10-CM | POA: Diagnosis not present

## 2023-06-08 DIAGNOSIS — M25662 Stiffness of left knee, not elsewhere classified: Secondary | ICD-10-CM | POA: Diagnosis not present

## 2023-06-13 DIAGNOSIS — M25662 Stiffness of left knee, not elsewhere classified: Secondary | ICD-10-CM | POA: Diagnosis not present

## 2023-06-13 DIAGNOSIS — R531 Weakness: Secondary | ICD-10-CM | POA: Diagnosis not present

## 2023-06-15 DIAGNOSIS — R531 Weakness: Secondary | ICD-10-CM | POA: Diagnosis not present

## 2023-06-15 DIAGNOSIS — M25661 Stiffness of right knee, not elsewhere classified: Secondary | ICD-10-CM | POA: Diagnosis not present

## 2023-06-19 DIAGNOSIS — R531 Weakness: Secondary | ICD-10-CM | POA: Diagnosis not present

## 2023-06-19 DIAGNOSIS — M25661 Stiffness of right knee, not elsewhere classified: Secondary | ICD-10-CM | POA: Diagnosis not present

## 2023-06-21 DIAGNOSIS — M25661 Stiffness of right knee, not elsewhere classified: Secondary | ICD-10-CM | POA: Diagnosis not present

## 2023-06-21 DIAGNOSIS — R531 Weakness: Secondary | ICD-10-CM | POA: Diagnosis not present

## 2023-07-03 DIAGNOSIS — M1712 Unilateral primary osteoarthritis, left knee: Secondary | ICD-10-CM | POA: Diagnosis not present

## 2023-07-20 DIAGNOSIS — H5213 Myopia, bilateral: Secondary | ICD-10-CM | POA: Diagnosis not present

## 2024-01-01 DIAGNOSIS — M79675 Pain in left toe(s): Secondary | ICD-10-CM | POA: Diagnosis not present

## 2024-02-05 ENCOUNTER — Other Ambulatory Visit: Payer: Self-pay

## 2024-02-05 ENCOUNTER — Other Ambulatory Visit (HOSPITAL_COMMUNITY): Payer: Self-pay

## 2024-02-05 DIAGNOSIS — K219 Gastro-esophageal reflux disease without esophagitis: Secondary | ICD-10-CM | POA: Diagnosis not present

## 2024-02-05 DIAGNOSIS — Z23 Encounter for immunization: Secondary | ICD-10-CM | POA: Diagnosis not present

## 2024-02-05 DIAGNOSIS — Z8673 Personal history of transient ischemic attack (TIA), and cerebral infarction without residual deficits: Secondary | ICD-10-CM | POA: Diagnosis not present

## 2024-02-05 DIAGNOSIS — E7800 Pure hypercholesterolemia, unspecified: Secondary | ICD-10-CM | POA: Diagnosis not present

## 2024-02-05 DIAGNOSIS — R7303 Prediabetes: Secondary | ICD-10-CM | POA: Diagnosis not present

## 2024-02-05 DIAGNOSIS — Z0000 Encounter for general adult medical examination without abnormal findings: Secondary | ICD-10-CM | POA: Diagnosis not present

## 2024-02-05 DIAGNOSIS — R232 Flushing: Secondary | ICD-10-CM | POA: Diagnosis not present

## 2024-02-05 MED ORDER — FAMOTIDINE 20 MG PO TABS
20.0000 mg | ORAL_TABLET | Freq: Every evening | ORAL | 1 refills | Status: AC | PRN
  Filled 2024-02-05: qty 30, 30d supply, fill #0
  Filled 2024-03-15: qty 30, 30d supply, fill #1

## 2024-04-07 ENCOUNTER — Other Ambulatory Visit (HOSPITAL_COMMUNITY): Payer: Self-pay

## 2024-04-08 ENCOUNTER — Other Ambulatory Visit (HOSPITAL_COMMUNITY): Payer: Self-pay

## 2024-04-08 MED ORDER — ATORVASTATIN CALCIUM 40 MG PO TABS
40.0000 mg | ORAL_TABLET | Freq: Every day | ORAL | 3 refills | Status: AC
  Filled 2024-04-08: qty 90, 90d supply, fill #0

## 2024-04-29 DIAGNOSIS — M1711 Unilateral primary osteoarthritis, right knee: Secondary | ICD-10-CM | POA: Diagnosis not present

## 2024-05-30 ENCOUNTER — Ambulatory Visit
Admission: RE | Admit: 2024-05-30 | Discharge: 2024-05-30 | Disposition: A | Discharge status: Home / Self Care | Source: Ambulatory Visit | Attending: Obstetrics and Gynecology | Admitting: Obstetrics and Gynecology

## 2024-05-30 ENCOUNTER — Other Ambulatory Visit: Payer: Self-pay | Admitting: Obstetrics and Gynecology

## 2024-05-30 ENCOUNTER — Other Ambulatory Visit: Payer: Self-pay | Admitting: Internal Medicine

## 2024-05-30 DIAGNOSIS — Z1231 Encounter for screening mammogram for malignant neoplasm of breast: Secondary | ICD-10-CM

## 2024-06-10 ENCOUNTER — Other Ambulatory Visit (HOSPITAL_COMMUNITY): Payer: Self-pay

## 2024-06-10 ENCOUNTER — Telehealth: Admitting: Physician Assistant

## 2024-06-10 DIAGNOSIS — B379 Candidiasis, unspecified: Secondary | ICD-10-CM

## 2024-06-10 DIAGNOSIS — T3695XA Adverse effect of unspecified systemic antibiotic, initial encounter: Secondary | ICD-10-CM

## 2024-06-10 DIAGNOSIS — J208 Acute bronchitis due to other specified organisms: Secondary | ICD-10-CM

## 2024-06-10 DIAGNOSIS — B9689 Other specified bacterial agents as the cause of diseases classified elsewhere: Secondary | ICD-10-CM | POA: Diagnosis not present

## 2024-06-10 MED ORDER — FLUCONAZOLE 150 MG PO TABS
150.0000 mg | ORAL_TABLET | ORAL | 0 refills | Status: AC | PRN
  Filled 2024-06-10: qty 2, 6d supply, fill #0

## 2024-06-10 MED ORDER — AZITHROMYCIN 250 MG PO TABS
ORAL_TABLET | ORAL | 0 refills | Status: DC
  Filled 2024-06-10: qty 6, 5d supply, fill #0

## 2024-06-10 MED ORDER — BENZONATATE 100 MG PO CAPS
100.0000 mg | ORAL_CAPSULE | Freq: Three times a day (TID) | ORAL | 0 refills | Status: AC | PRN
  Filled 2024-06-10: qty 30, 5d supply, fill #0

## 2024-06-10 NOTE — Progress Notes (Signed)
 " Virtual Visit Consent   Hannah Brady, you are scheduled for a virtual visit with a [facility] provider today. Just as with appointments in the office, your consent must be obtained to participate. Your consent will be active for this visit and any virtual visit you may have with one of our providers in the next 365 days. If you have a MyChart account, a copy of this consent can be sent to you electronically.  As this is a virtual visit, video technology does not allow for your provider to perform a traditional examination. This may limit your provider's ability to fully assess your condition. If your provider identifies any concerns that need to be evaluated in person or the need to arrange testing (such as labs, EKG, etc.), we will make arrangements to do so. Although advances in technology are sophisticated, we cannot ensure that it will always work on either your end or our end. If the connection with a video visit is poor, the visit may have to be switched to a telephone visit. With either a video or telephone visit, we are not always able to ensure that we have a secure connection.  By engaging in this virtual visit, you consent to the provision of healthcare and authorize for your insurance to be billed (if applicable) for the services provided during this visit. Depending on your insurance coverage, you may receive a charge related to this service.  I need to obtain your verbal consent now. Are you willing to proceed with your visit today? Hannah Brady has provided verbal consent on 06/10/2024 for a virtual visit (video or telephone). [name], PA-C  Date: 06/10/2024 10:58 AM   Virtual Visit via Video Note   I, [name], connected with  Hannah Brady  ([id number], Mar 22, 1961) on 06/10/24 at 10:15 AM EST by a video-enabled telemedicine application and verified that I am speaking with the correct person using two  identifiers.  Location: Patient: Virtual Visit Location Patient: Home Provider: Virtual Visit Location Provider: Home Office   I discussed the limitations of evaluation and management by telemedicine and the availability of in person appointments. The patient expressed understanding and agreed to proceed.    History of Present Illness: Hannah Brady is a 64 y.o. who identifies as a female who was assigned female at birth, and is being seen today for cough and congestion.  HPI: URI  This is a recurrent problem. The current episode started 1 to 4 weeks ago (Had URI symptoms 2 weeks ago that resolved, now have runny nose, congestion and cough returned). The problem has been gradually worsening. There has been no fever. Associated symptoms include congestion, coughing (worse this time), headaches, rhinorrhea and wheezing. Pertinent negatives include no diarrhea, ear pain, nausea, plugged ear sensation, sinus pain, sore throat or vomiting. Treatments tried: advil sinus and cold, mucinex dm, nyquil, alka seltzer cough and cold. The treatment provided no relief.     Problems:  Patient Active Problem List   Diagnosis Date Noted   Personal history of transient ischemic attack (TIA), and cerebral infarction without residual deficits 12/28/2021   Obesity 12/28/2021   Menopausal symptom 12/28/2021   Gastro-esophageal reflux disease without esophagitis 12/28/2021   Menopausal and female climacteric states 12/28/2021   Changing skin lesion 06/21/2021   Other hyperlipidemia 10/07/2020   Hyperlipemia 03/23/2016   TIA (transient ischemic attack) 06/15/2015   Elevated blood pressure 06/15/2015   Hyperglycemia 06/15/2015   Headache, common migraine 06/15/2015  Acute CVA (cerebrovascular accident) (HCC) 06/15/2015   Nonintractable common migraine    Left-sided weakness     Allergies: Allergies[1] Medications: Current Medications[2]  Observations/Objective: Patient is well-developed,  well-nourished in no acute distress.  Resting comfortably at home.  Head is normocephalic, atraumatic.  No labored breathing.  Speech is clear and coherent with logical content.  Patient is alert and oriented at baseline.  Barking cough  Assessment and Plan: 1. Acute bacterial bronchitis (Primary) - azithromycin  (ZITHROMAX ) 250 MG tablet; Take 2 tablets (500 mg total) by mouth daily for 1 day, THEN 1 tablet (250 mg total) daily for 4 days.  Dispense: 6 tablet; Refill: 0 - benzonatate  (TESSALON ) 100 MG capsule; Take 1-2 capsules (100-200 mg total) by mouth 3 (three) times daily as needed.  Dispense: 30 capsule; Refill: 0  2. Antibiotic-induced yeast infection - fluconazole  (DIFLUCAN ) 150 MG tablet; Take 1 tablet (150 mg total) by mouth every 3 (three) days as needed.  Dispense: 2 tablet; Refill: 0  - Suspect second sickening - Azithromycin  added - Tessalon  perles for cough - May use other OTC medications as needed - Steam and humidifier can help - Push fluids - Rest as needed - Diflucan  given as prophylaxis as patient tends to get vaginal yeast infections with antibiotic use - Seek in person evaluation if worsening or fails to improve  Follow Up Instructions: I discussed the assessment and treatment plan with the patient. The patient was provided an opportunity to ask questions and all were answered. The patient agreed with the plan and demonstrated an understanding of the instructions.  A copy of instructions were sent to the patient via MyChart unless otherwise noted below.    The patient was advised to call back or seek an in-person evaluation if the symptoms worsen or if the condition fails to improve as anticipated.    Delon HERO Jaquavian Firkus, PA-C     [1] No Known Allergies [2]  Current Outpatient Medications:    azithromycin  (ZITHROMAX ) 250 MG tablet, Take 2 tablets (500 mg total) by mouth daily for 1 day, THEN 1 tablet (250 mg total) daily for 4 days., Disp: 6 tablet, Rfl:  0   benzonatate  (TESSALON ) 100 MG capsule, Take 1-2 capsules (100-200 mg total) by mouth 3 (three) times daily as needed., Disp: 30 capsule, Rfl: 0   fluconazole  (DIFLUCAN ) 150 MG tablet, Take 1 tablet (150 mg total) by mouth every 3 (three) days as needed., Disp: 2 tablet, Rfl: 0   albuterol  (VENTOLIN  HFA) 108 (90 Base) MCG/ACT inhaler, Inhale 2 puffs into the lungs every 4 (four) hours as needed for 10 days (Patient not taking: Reported on 05/19/2023), Disp: 6.7 g, Rfl: 0   ascorbic acid (VITAMIN C) 500 MG tablet, Take 500 mg by mouth daily., Disp: , Rfl:    aspirin  EC 81 MG tablet, Take 1 tablet (81 mg total) by mouth daily., Disp: 90 tablet, Rfl: 3   atorvastatin  (LIPITOR) 40 MG tablet, Take 1 tablet (40 mg total) by mouth once  daily at 6 pm., Disp: 90 tablet, Rfl: 5   atorvastatin  (LIPITOR) 40 MG tablet, Take 1 tablet (40 mg total) by mouth daily at 6pm., Disp: 90 tablet, Rfl: 3   Biotin 10000 MCG TABS, Take 1 tablet by mouth daily., Disp: , Rfl:    Cholecalciferol (VITAMIN D) 125 MCG (5000 UT) CAPS, Take 1 capsule by mouth daily., Disp: , Rfl:    Cinnamon 500 MG TABS, Take 500 mg by mouth daily., Disp: , Rfl:  escitalopram  (LEXAPRO ) 10 MG tablet, Take 2 tablets (20 mg total) by mouth daily- can decrease to 1 tab daily as needed (Patient not taking: Reported on 05/19/2023), Disp: 180 tablet, Rfl: 2   escitalopram  (LEXAPRO ) 10 MG tablet, Take 1/2 tablet (5 mg total) by mouth daily. (Patient not taking: Reported on 05/19/2023), Disp: 45 tablet, Rfl: 3   famotidine  (PEPCID ) 20 MG tablet, Take 1 tablet (20 mg total) by mouth at bedtime as needed., Disp: 30 tablet, Rfl: 1   loratadine  (CLARITIN ) 10 MG tablet, Take 1 tablet (10 mg total) by mouth daily. (Patient taking differently: Take 10 mg by mouth daily as needed for allergies.), Disp: 100 tablet, Rfl: 0   Melatonin 1 MG CHEW, Chew 1 mg by mouth at bedtime as needed (sleep)., Disp: , Rfl:    Multiple Vitamin (MULTIVITAMIN WITH MINERALS) TABS  tablet, Take 1 tablet by mouth daily., Disp: , Rfl:    ondansetron  (ZOFRAN ) 4 MG tablet, Take 1 tablet (4 mg total) by mouth every 8 (eight) hours as needed for nausea or vomiting., Disp: 20 tablet, Rfl: 0   zinc gluconate 50 MG tablet, Take 50 mg by mouth daily., Disp: , Rfl:   "

## 2024-06-10 NOTE — Patient Instructions (Signed)
 " Hannah Brady, thank you for joining [name], PA-C for today's virtual visit.  While this provider is not your primary care provider (PCP), if your PCP is located in our provider database this encounter information will be shared with them immediately following your visit.   A [facility] MyChart account gives you access to today's visit and all your visits, tests, and labs performed at [facility]  click here if you don't have a [facility] MyChart account or go to [email or web address]  Consent: (Patient) Hannah Brady provided verbal consent for this virtual visit at the beginning of the encounter.  Current Medications:  Current Outpatient Medications:    azithromycin  (ZITHROMAX ) 250 MG tablet, Take 2 tablets (500 mg total) by mouth daily for 1 day, THEN 1 tablet (250 mg total) daily for 4 days., Disp: 6 tablet, Rfl: 0   benzonatate  (TESSALON ) 100 MG capsule, Take 1-2 capsules (100-200 mg total) by mouth 3 (three) times daily as needed., Disp: 30 capsule, Rfl: 0   fluconazole  (DIFLUCAN ) 150 MG tablet, Take 1 tablet (150 mg total) by mouth every 3 (three) days as needed., Disp: 2 tablet, Rfl: 0   albuterol  (VENTOLIN  HFA) 108 (90 Base) MCG/ACT inhaler, Inhale 2 puffs into the lungs every 4 (four) hours as needed for 10 days (Patient not taking: Reported on 05/19/2023), Disp: 6.7 g, Rfl: 0   ascorbic acid (VITAMIN C) 500 MG tablet, Take 500 mg by mouth daily., Disp: , Rfl:    aspirin  EC 81 MG tablet, Take 1 tablet (81 mg total) by mouth daily., Disp: 90 tablet, Rfl: 3   atorvastatin  (LIPITOR) 40 MG tablet, Take 1 tablet (40 mg total) by mouth once  daily at 6 pm., Disp: 90 tablet, Rfl: 5   atorvastatin  (LIPITOR) 40 MG tablet, Take 1 tablet (40 mg total) by mouth daily at 6pm., Disp: 90 tablet, Rfl: 3   Biotin 10000 MCG TABS, Take 1 tablet by mouth daily., Disp: , Rfl:    Cholecalciferol (VITAMIN D) 125 MCG (5000 UT) CAPS, Take 1 capsule by mouth  daily., Disp: , Rfl:    Cinnamon 500 MG TABS, Take 500 mg by mouth daily., Disp: , Rfl:    escitalopram  (LEXAPRO ) 10 MG tablet, Take 2 tablets (20 mg total) by mouth daily- can decrease to 1 tab daily as needed (Patient not taking: Reported on 05/19/2023), Disp: 180 tablet, Rfl: 2   escitalopram  (LEXAPRO ) 10 MG tablet, Take 1/2 tablet (5 mg total) by mouth daily. (Patient not taking: Reported on 05/19/2023), Disp: 45 tablet, Rfl: 3   famotidine  (PEPCID ) 20 MG tablet, Take 1 tablet (20 mg total) by mouth at bedtime as needed., Disp: 30 tablet, Rfl: 1   loratadine  (CLARITIN ) 10 MG tablet, Take 1 tablet (10 mg total) by mouth daily. (Patient taking differently: Take 10 mg by mouth daily as needed for allergies.), Disp: 100 tablet, Rfl: 0   Melatonin 1 MG CHEW, Chew 1 mg by mouth at bedtime as needed (sleep)., Disp: , Rfl:    Multiple Vitamin (MULTIVITAMIN WITH MINERALS) TABS tablet, Take 1 tablet by mouth daily., Disp: , Rfl:    ondansetron  (ZOFRAN ) 4 MG tablet, Take 1 tablet (4 mg total) by mouth every 8 (eight) hours as needed for nausea or vomiting., Disp: 20 tablet, Rfl: 0   zinc gluconate 50 MG tablet, Take 50 mg by mouth daily., Disp: , Rfl:    Medications ordered in this encounter:  Meds ordered this encounter  Medications  azithromycin  (ZITHROMAX ) 250 MG tablet    Sig: Take 2 tablets (500 mg total) by mouth daily for 1 day, THEN 1 tablet (250 mg total) daily for 4 days.    Dispense:  6 tablet    Refill:  0    Supervising Provider:   LAMPTEY, PHILIP O [[id number]]   benzonatate  (TESSALON ) 100 MG capsule    Sig: Take 1-2 capsules (100-200 mg total) by mouth 3 (three) times daily as needed.    Dispense:  30 capsule    Refill:  0    Supervising Provider:   LAMPTEY, PHILIP O [[id number]]   fluconazole  (DIFLUCAN ) 150 MG tablet    Sig: Take 1 tablet (150 mg total) by mouth every 3 (three) days as needed.    Dispense:  2 tablet    Refill:  0    Supervising Provider:   LAMPTEY, PHILIP O [[id number]]      *If you need refills on other medications prior to your next appointment, please contact your pharmacy*  Follow-Up: Call back or seek an in-person evaluation if the symptoms worsen or if the condition fails to improve as anticipated.  [facility] [phone]  Other Instructions Upper Respiratory Infection, Adult An upper respiratory infection (URI) is a common viral infection of the nose, throat, and upper air passages that lead to the lungs. The most common type of URI is the common cold. URIs usually get better on their own, without medical treatment. What are the causes? A URI is caused by a virus. You may catch a virus by: Breathing in droplets from an infected person's cough or sneeze. Touching something that has been exposed to the virus (is contaminated) and then touching your mouth, nose, or eyes. What increases the risk? You are more likely to get a URI if: You are very young or very old. You have close contact with others, such as at work, school, or a health care facility. You smoke. You have long-term (chronic) heart or lung disease. You have a weakened disease-fighting system (immune system). You have nasal allergies or asthma. You are experiencing a lot of stress. You have poor nutrition. What are the signs or symptoms? A URI usually involves some of the following symptoms: Runny or stuffy (congested) nose. Cough. Sneezing. Sore throat. Headache. Fatigue. Fever. Loss of appetite. Pain in your forehead, behind your eyes, and over your cheekbones (sinus pain). Muscle aches. Redness or irritation of the eyes. Pressure in the ears or face. How is this diagnosed? This condition may be diagnosed based on your medical history and symptoms, and a physical exam. Your health care provider may use a swab to take a mucus sample from your nose (nasal swab). This sample can be tested to determine what virus is causing the illness. How is this  treated? URIs usually get better on their own within 7-10 days. Medicines cannot cure URIs, but your health care provider may recommend certain medicines to help relieve symptoms, such as: Over-the-counter cold medicines. Cough suppressants. Coughing is a type of defense against infection that helps to clear the respiratory system, so take these medicines only as recommended by your health care provider. Fever-reducing medicines. Follow these instructions at home: Activity Rest as needed. If you have a fever, stay home from work or school until your fever is gone or until your health care provider says your URI cannot spread to other people (is no longer contagious). Your health care provider may have you wear a face mask  to prevent your infection from spreading. Relieving symptoms Gargle with a mixture of salt and water 3-4 times a day or as needed. To make salt water, completely dissolve -1 tsp (3-6 g) of salt in 1 cup (237 mL) of warm water. Use a cool-mist humidifier to add moisture to the air. This can help you breathe more easily. Eating and drinking  Drink enough fluid to keep your urine pale yellow. Eat soups and other clear broths. General instructions  Take over-the-counter and prescription medicines only as told by your health care provider. These include cold medicines, fever reducers, and cough suppressants. Do not use any products that contain nicotine or tobacco. These products include cigarettes, chewing tobacco, and vaping devices, such as e-cigarettes. If you need help quitting, ask your health care provider. Stay away from secondhand smoke. Stay up to date on all immunizations, including the yearly (annual) flu vaccine. Keep all follow-up visits. This is important. How to prevent the spread of infection to others URIs can be contagious. To prevent the infection from spreading: Wash your hands with soap and water for at least 20 seconds. If soap and water are not  available, use hand sanitizer. Avoid touching your mouth, face, eyes, or nose. Cough or sneeze into a tissue or your sleeve or elbow instead of into your hand or into the air.  Contact a health care provider if: You are getting worse instead of better. You have a fever or chills. Your mucus is brown or red. You have yellow or brown discharge coming from your nose. You have pain in your face, especially when you bend forward. You have swollen neck glands. You have pain while swallowing. You have white areas in the back of your throat. Get help right away if: You have shortness of breath that gets worse. You have severe or persistent: Headache. Ear pain. Sinus pain. Chest pain. You have chronic lung disease along with any of the following: Making high-pitched whistling sounds when you breathe, most often when you breathe out (wheezing). Prolonged cough (more than 14 days). Coughing up blood. A change in your usual mucus. You have a stiff neck. You have changes in your: Vision. Hearing. Thinking. Mood. These symptoms may be an emergency. Get help right away. Call 911. Do not wait to see if the symptoms will go away. Do not drive yourself to the hospital. Summary An upper respiratory infection (URI) is a common infection of the nose, throat, and upper air passages that lead to the lungs. A URI is caused by a virus. URIs usually get better on their own within 7-10 days. Medicines cannot cure URIs, but your health care provider may recommend certain medicines to help relieve symptoms. This information is not intended to replace advice given to you by your health care provider. Make sure you discuss any questions you have with your health care provider. Document Revised: 12/09/2020 Document Reviewed: 12/09/2020 Elsevier Patient Education  2024 [employer]   If you have been instructed to have an in-person evaluation today at a local Urgent Care facility, please use the link  below. It will take you to a list of all of our available [facility], including address, phone number and hours of operation. Please do not delay care.  [facility]  If you or a family member do not have a primary care provider, use the link below to schedule a visit and establish care. When you choose a [facility] primary care physician or  advanced practice provider, you gain a long-term partner in health. Find a Primary Care Provider  Learn more about [facility]'s in-office and virtual care options: [facility] - Get Care Now "

## 2024-06-11 ENCOUNTER — Telehealth: Admitting: Emergency Medicine

## 2024-06-11 ENCOUNTER — Encounter: Payer: Self-pay | Admitting: Physician Assistant

## 2024-06-11 ENCOUNTER — Encounter: Payer: Self-pay | Admitting: Pharmacist

## 2024-06-11 ENCOUNTER — Other Ambulatory Visit (HOSPITAL_COMMUNITY): Payer: Self-pay

## 2024-06-11 DIAGNOSIS — J208 Acute bronchitis due to other specified organisms: Secondary | ICD-10-CM | POA: Diagnosis not present

## 2024-06-11 DIAGNOSIS — B9689 Other specified bacterial agents as the cause of diseases classified elsewhere: Secondary | ICD-10-CM

## 2024-06-11 MED ORDER — AZITHROMYCIN 250 MG PO TABS
250.0000 mg | ORAL_TABLET | Freq: Once | ORAL | 0 refills | Status: AC
  Filled 2024-06-11 – 2024-06-14 (×2): qty 1, 1d supply, fill #0

## 2024-06-11 MED ORDER — ALBUTEROL SULFATE HFA 108 (90 BASE) MCG/ACT IN AERS
2.0000 | INHALATION_SPRAY | RESPIRATORY_TRACT | 0 refills | Status: AC | PRN
  Filled 2024-06-11: qty 6.7, 17d supply, fill #0

## 2024-06-11 MED ORDER — PROMETHAZINE-DM 6.25-15 MG/5ML PO SYRP
5.0000 mL | ORAL_SOLUTION | Freq: Four times a day (QID) | ORAL | 0 refills | Status: AC | PRN
  Filled 2024-06-11: qty 120, 6d supply, fill #0

## 2024-06-11 MED ORDER — PREDNISONE 20 MG PO TABS
40.0000 mg | ORAL_TABLET | Freq: Every day | ORAL | 0 refills | Status: AC
  Filled 2024-06-11: qty 10, 5d supply, fill #0

## 2024-06-11 NOTE — Progress Notes (Signed)
 " Virtual Visit Consent   Hannah Brady, you are scheduled for a virtual visit with a [facility] provider today. Just as with appointments in the office, your consent must be obtained to participate. Your consent will be active for this visit and any virtual visit you may have with one of our providers in the next 365 days. If you have a MyChart account, a copy of this consent can be sent to you electronically.  As this is a virtual visit, video technology does not allow for your provider to perform a traditional examination. This may limit your provider's ability to fully assess your condition. If your provider identifies any concerns that need to be evaluated in person or the need to arrange testing (such as labs, EKG, etc.), we will make arrangements to do so. Although advances in technology are sophisticated, we cannot ensure that it will always work on either your end or our end. If the connection with a video visit is poor, the visit may have to be switched to a telephone visit. With either a video or telephone visit, we are not always able to ensure that we have a secure connection.  By engaging in this virtual visit, you consent to the provision of healthcare and authorize for your insurance to be billed (if applicable) for the services provided during this visit. Depending on your insurance coverage, you may receive a charge related to this service.  I need to obtain your verbal consent now. Are you willing to proceed with your visit today? Hannah Brady has provided verbal consent on 06/11/2024 for a virtual visit (video or telephone). Jon CHRISTELLA Belt, NP  Date: 06/11/2024 2:28 PM   Virtual Visit via Video Note   I, Jon CHRISTELLA Belt, connected with  Hannah Brady  ([id number], 1960-08-05) on 06/11/24 at  2:15 PM EST by a video-enabled telemedicine application and verified that I am speaking with the correct person using two identifiers.  Location: Patient:  Virtual Visit Location Patient: Other: parked car in [place]  Provider: Virtual Visit Location Provider: Home Office   I discussed the limitations of evaluation and management by telemedicine and the availability of in person appointments. The patient expressed understanding and agreed to proceed.    History of Present Illness: Hannah Brady is a 64 y.o. who identifies as a female who was assigned female at birth, and is being seen today for bronchitis.    Seen yesterdaty by video for same. Lost one of her zithromax  pillsin the trash by accident, requests replacement pill  Has been sick for about a month. Was getting better, then got worse. Seen note by DOROTHA Dickinson, PA from 06/10/24  Still coughing and coughing a barky cough. Requests albuterol  inhaler refill - does not hav asthma but has needed to use an inhaler with bronchitis in the past and thinks she needs it now. Is not SOB or wheezing.   Also requests prednisone  to help with cough and different cough medicine as tessalon  perles are not helping much.   HPI: HPI  Problems:  Patient Active Problem List   Diagnosis Date Noted   Personal history of transient ischemic attack (TIA), and cerebral infarction without residual deficits 12/28/2021   Obesity 12/28/2021   Menopausal symptom 12/28/2021   Gastro-esophageal reflux disease without esophagitis 12/28/2021   Menopausal and female climacteric states 12/28/2021   Changing skin lesion 06/21/2021   Other hyperlipidemia 10/07/2020   Hyperlipemia 03/23/2016   TIA (transient ischemic attack) 06/15/2015  Elevated blood pressure 06/15/2015   Hyperglycemia 06/15/2015   Headache, common migraine 06/15/2015   Acute CVA (cerebrovascular accident) (HCC) 06/15/2015   Nonintractable common migraine    Left-sided weakness     Allergies: Allergies[1] Medications: Current Medications[2]  Observations/Objective: Patient is well-developed, well-nourished in no acute distress.  Resting  comfortably in parked car in [place].  Head is normocephalic, atraumatic.  No labored breathing. Frequent barky cough Speech is clear and coherent with logical content.  Patient is alert and oriented at baseline.    Assessment and Plan: 1. Acute bacterial bronchitis (Primary) - albuterol  (VENTOLIN  HFA) 108 (90 Base) MCG/ACT inhaler; Inhale 2 puffs into the lungs every 4 (four) hours as needed for 10 days  Dispense: 6.7 g; Refill: 0 - predniSONE  (DELTASONE ) 20 MG tablet; Take 2 tablets (40 mg total) by mouth daily with breakfast for 5 days.  Dispense: 10 tablet; Refill: 0 - azithromycin  (ZITHROMAX ) 250 MG tablet; Take on last day of prior azithromycin  prescription to replace lost pill  Dispense: 1 each; Refill: 0 - promethazine -dextromethorphan (PROMETHAZINE -DM) 6.25-15 MG/5ML syrup; Take 5 mLs by mouth 4 (four) times daily as needed for cough.  Dispense: 120 mL; Refill: 0  I replaced missing zithromax , rx albuteorl and prednisone  as requested, and rx phenergan  dm to try at night, tessalon  perles during day.   She will need in person eval if not getting better  Follow Up Instructions: I discussed the assessment and treatment plan with the patient. The patient was provided an opportunity to ask questions and all were answered. The patient agreed with the plan and demonstrated an understanding of the instructions.  A copy of instructions were sent to the patient via MyChart unless otherwise noted below.   The patient was advised to call back or seek an in-person evaluation if the symptoms worsen or if the condition fails to improve as anticipated.    Jon CHRISTELLA Belt, NP    [1] No Known Allergies [2]  Current Outpatient Medications:    azithromycin  (ZITHROMAX ) 250 MG tablet, Take on last day of prior azithromycin  prescription to replace lost pill, Disp: 1 each, Rfl: 0   predniSONE  (DELTASONE ) 20 MG tablet, Take 2 tablets (40 mg total) by mouth daily with breakfast for 5 days., Disp: 10 tablet,  Rfl: 0   promethazine -dextromethorphan (PROMETHAZINE -DM) 6.25-15 MG/5ML syrup, Take 5 mLs by mouth 4 (four) times daily as needed for cough., Disp: 120 mL, Rfl: 0   albuterol  (VENTOLIN  HFA) 108 (90 Base) MCG/ACT inhaler, Inhale 2 puffs into the lungs every 4 (four) hours as needed for 10 days, Disp: 6.7 g, Rfl: 0   ascorbic acid (VITAMIN C) 500 MG tablet, Take 500 mg by mouth daily., Disp: , Rfl:    aspirin  EC 81 MG tablet, Take 1 tablet (81 mg total) by mouth daily., Disp: 90 tablet, Rfl: 3   atorvastatin  (LIPITOR) 40 MG tablet, Take 1 tablet (40 mg total) by mouth once  daily at 6 pm., Disp: 90 tablet, Rfl: 5   atorvastatin  (LIPITOR) 40 MG tablet, Take 1 tablet (40 mg total) by mouth daily at 6pm., Disp: 90 tablet, Rfl: 3   benzonatate  (TESSALON ) 100 MG capsule, Take 1-2 capsules (100-200 mg total) by mouth 3 (three) times daily as needed., Disp: 30 capsule, Rfl: 0   Biotin 10000 MCG TABS, Take 1 tablet by mouth daily., Disp: , Rfl:    Cholecalciferol (VITAMIN D) 125 MCG (5000 UT) CAPS, Take 1 capsule by mouth daily., Disp: , Rfl:  Cinnamon 500 MG TABS, Take 500 mg by mouth daily., Disp: , Rfl:    escitalopram  (LEXAPRO ) 10 MG tablet, Take 2 tablets (20 mg total) by mouth daily- can decrease to 1 tab daily as needed (Patient not taking: Reported on 05/19/2023), Disp: 180 tablet, Rfl: 2   escitalopram  (LEXAPRO ) 10 MG tablet, Take 1/2 tablet (5 mg total) by mouth daily. (Patient not taking: Reported on 05/19/2023), Disp: 45 tablet, Rfl: 3   famotidine  (PEPCID ) 20 MG tablet, Take 1 tablet (20 mg total) by mouth at bedtime as needed., Disp: 30 tablet, Rfl: 1   fluconazole  (DIFLUCAN ) 150 MG tablet, Take 1 tablet (150 mg total) by mouth every 3 (three) days as needed., Disp: 2 tablet, Rfl: 0   loratadine  (CLARITIN ) 10 MG tablet, Take 1 tablet (10 mg total) by mouth daily. (Patient taking differently: Take 10 mg by mouth daily as needed for allergies.), Disp: 100 tablet, Rfl: 0   Melatonin 1 MG CHEW,  Chew 1 mg by mouth at bedtime as needed (sleep)., Disp: , Rfl:    Multiple Vitamin (MULTIVITAMIN WITH MINERALS) TABS tablet, Take 1 tablet by mouth daily., Disp: , Rfl:    ondansetron  (ZOFRAN ) 4 MG tablet, Take 1 tablet (4 mg total) by mouth every 8 (eight) hours as needed for nausea or vomiting., Disp: 20 tablet, Rfl: 0   zinc gluconate 50 MG tablet, Take 50 mg by mouth daily., Disp: , Rfl:   "

## 2024-06-11 NOTE — Telephone Encounter (Signed)
 This concern addressed during virtual urgent care appt 215 on 06/11/24. No further routing/care needed.   Jon Belt, PhD, FNP-BC [facility] Phone: [phone] 06/11/2024 2:32 PM

## 2024-06-11 NOTE — Patient Instructions (Signed)
 " Hannah Brady, thank you for joining Jon CHRISTELLA Belt, NP for today's virtual visit.  While this provider is not your primary care provider (PCP), if your PCP is located in our provider database this encounter information will be shared with them immediately following your visit.   A [facility] MyChart account gives you access to today's visit and all your visits, tests, and labs performed at [facility]  click here if you don't have a [facility] MyChart account or go to [email or web address]  Consent: (Patient) Hannah Brady provided verbal consent for this virtual visit at the beginning of the encounter.  Current Medications:  Current Outpatient Medications:    azithromycin  (ZITHROMAX ) 250 MG tablet, Take 1 tablet (250 mg total) by mouth on last day of prior azithromycin  prescription to replace lost pill., Disp: 1 tablet, Rfl: 0   predniSONE  (DELTASONE ) 20 MG tablet, Take 2 tablets (40 mg total) by mouth daily with breakfast for 5 days., Disp: 10 tablet, Rfl: 0   promethazine -dextromethorphan (PROMETHAZINE -DM) 6.25-15 MG/5ML syrup, Take 5 mLs by mouth 4 (four) times daily as needed for cough., Disp: 120 mL, Rfl: 0   albuterol  (VENTOLIN  HFA) 108 (90 Base) MCG/ACT inhaler, Inhale 2 puffs into the lungs every 4 (four) hours as needed for 10 days, Disp: 6.7 g, Rfl: 0   ascorbic acid (VITAMIN C) 500 MG tablet, Take 500 mg by mouth daily., Disp: , Rfl:    aspirin  EC 81 MG tablet, Take 1 tablet (81 mg total) by mouth daily., Disp: 90 tablet, Rfl: 3   atorvastatin  (LIPITOR) 40 MG tablet, Take 1 tablet (40 mg total) by mouth once  daily at 6 pm., Disp: 90 tablet, Rfl: 5   atorvastatin  (LIPITOR) 40 MG tablet, Take 1 tablet (40 mg total) by mouth daily at 6pm., Disp: 90 tablet, Rfl: 3   benzonatate  (TESSALON ) 100 MG capsule, Take 1-2 capsules (100-200 mg total) by mouth 3 (three) times daily as needed., Disp: 30 capsule, Rfl: 0   Biotin 10000 MCG TABS, Take 1 tablet  by mouth daily., Disp: , Rfl:    Cholecalciferol (VITAMIN D) 125 MCG (5000 UT) CAPS, Take 1 capsule by mouth daily., Disp: , Rfl:    Cinnamon 500 MG TABS, Take 500 mg by mouth daily., Disp: , Rfl:    escitalopram  (LEXAPRO ) 10 MG tablet, Take 2 tablets (20 mg total) by mouth daily- can decrease to 1 tab daily as needed (Patient not taking: Reported on 05/19/2023), Disp: 180 tablet, Rfl: 2   escitalopram  (LEXAPRO ) 10 MG tablet, Take 1/2 tablet (5 mg total) by mouth daily. (Patient not taking: Reported on 05/19/2023), Disp: 45 tablet, Rfl: 3   famotidine  (PEPCID ) 20 MG tablet, Take 1 tablet (20 mg total) by mouth at bedtime as needed., Disp: 30 tablet, Rfl: 1   fluconazole  (DIFLUCAN ) 150 MG tablet, Take 1 tablet (150 mg total) by mouth every 3 (three) days as needed., Disp: 2 tablet, Rfl: 0   loratadine  (CLARITIN ) 10 MG tablet, Take 1 tablet (10 mg total) by mouth daily. (Patient taking differently: Take 10 mg by mouth daily as needed for allergies.), Disp: 100 tablet, Rfl: 0   Melatonin 1 MG CHEW, Chew 1 mg by mouth at bedtime as needed (sleep)., Disp: , Rfl:    Multiple Vitamin (MULTIVITAMIN WITH MINERALS) TABS tablet, Take 1 tablet by mouth daily., Disp: , Rfl:    ondansetron  (ZOFRAN ) 4 MG tablet, Take 1 tablet (4 mg total) by mouth every 8 (eight) hours as  needed for nausea or vomiting., Disp: 20 tablet, Rfl: 0   zinc gluconate 50 MG tablet, Take 50 mg by mouth daily., Disp: , Rfl:    Medications ordered in this encounter:  Meds ordered this encounter  Medications   albuterol  (VENTOLIN  HFA) 108 (90 Base) MCG/ACT inhaler    Sig: Inhale 2 puffs into the lungs every 4 (four) hours as needed for 10 days    Dispense:  6.7 g    Refill:  0   predniSONE  (DELTASONE ) 20 MG tablet    Sig: Take 2 tablets (40 mg total) by mouth daily with breakfast for 5 days.    Dispense:  10 tablet    Refill:  0   azithromycin  (ZITHROMAX ) 250 MG tablet    Sig: Take 1 tablet (250 mg total) by mouth on last day of prior  azithromycin  prescription to replace lost pill.    Dispense:  1 tablet    Refill:  0    She dropped one dose in trash   promethazine -dextromethorphan (PROMETHAZINE -DM) 6.25-15 MG/5ML syrup    Sig: Take 5 mLs by mouth 4 (four) times daily as needed for cough.    Dispense:  120 mL    Refill:  0     *If you need refills on other medications prior to your next appointment, please contact your pharmacy*  Follow-Up: Call back or seek an in-person evaluation if the symptoms worsen or if the condition fails to improve as anticipated.  [facility] [phone]  Other Instructions  Use the new cough medicine at night - it may make you sleepy. Use the benzonatate  during the day if you need to work or drive.   If you are not getting better with this treatment, please get checked in person so someone can listen to your breathing   If you have been instructed to have an in-person evaluation today at a local Urgent Care facility, please use the link below. It will take you to a list of all of our available [facility], including address, phone number and hours of operation. Please do not delay care.  [facility]  If you or a family member do not have a primary care provider, use the link below to schedule a visit and establish care. When you choose a [facility] primary care physician or advanced practice provider, you gain a long-term partner in health. Find a Primary Care Provider  Learn more about [facility]'s in-office and virtual care options: [facility] Get Care Now  "

## 2024-06-14 ENCOUNTER — Other Ambulatory Visit (HOSPITAL_COMMUNITY): Payer: Self-pay
